# Patient Record
Sex: Female | Born: 1995 | Race: White | Hispanic: No | Marital: Married | Smoking: Current every day smoker
Health system: Southern US, Community
[De-identification: ages and names within clinical notes are randomized; demographics above are authoritative.]

## PROBLEM LIST (undated history)

## (undated) DIAGNOSIS — F329 Major depressive disorder, single episode, unspecified: Secondary | ICD-10-CM

## (undated) DIAGNOSIS — K219 Gastro-esophageal reflux disease without esophagitis: Secondary | ICD-10-CM

## (undated) DIAGNOSIS — F32A Depression, unspecified: Secondary | ICD-10-CM

## (undated) HISTORY — PX: SUPERFICIAL LYMPH NODE BIOPSY / EXCISION: SUR127

---
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
## 2017-12-22 ENCOUNTER — Encounter: Payer: Self-pay | Admitting: Emergency Medicine

## 2017-12-22 ENCOUNTER — Emergency Department
Admission: EM | Admit: 2017-12-22 | Discharge: 2017-12-22 | Disposition: A | Discharge status: Home / Self Care | Payer: Self-pay | Attending: Emergency Medicine | Admitting: Emergency Medicine

## 2017-12-22 ENCOUNTER — Other Ambulatory Visit: Payer: Self-pay

## 2017-12-22 DIAGNOSIS — Z5321 Procedure and treatment not carried out due to patient leaving prior to being seen by health care provider: Secondary | ICD-10-CM | POA: Insufficient documentation

## 2017-12-22 DIAGNOSIS — K0889 Other specified disorders of teeth and supporting structures: Secondary | ICD-10-CM | POA: Insufficient documentation

## 2017-12-22 NOTE — ED Triage Notes (Signed)
PT c/o wisdom tooth pain x77months but worsening xfew days. Denies fever.

## 2018-02-25 ENCOUNTER — Emergency Department: Payer: Self-pay

## 2018-02-25 ENCOUNTER — Other Ambulatory Visit: Payer: Self-pay

## 2018-02-25 ENCOUNTER — Emergency Department
Admission: EM | Admit: 2018-02-25 | Discharge: 2018-02-25 | Disposition: A | Discharge status: Home / Self Care | Payer: Self-pay | Attending: Emergency Medicine | Admitting: Emergency Medicine

## 2018-02-25 ENCOUNTER — Encounter: Payer: Self-pay | Admitting: Emergency Medicine

## 2018-02-25 DIAGNOSIS — R0981 Nasal congestion: Secondary | ICD-10-CM | POA: Insufficient documentation

## 2018-02-25 DIAGNOSIS — J209 Acute bronchitis, unspecified: Secondary | ICD-10-CM | POA: Insufficient documentation

## 2018-02-25 DIAGNOSIS — F17210 Nicotine dependence, cigarettes, uncomplicated: Secondary | ICD-10-CM | POA: Insufficient documentation

## 2018-02-25 MED ORDER — BENZONATATE 200 MG PO CAPS: 200.0000 mg | ORAL_CAPSULE | Freq: Three times a day (TID) | ORAL | 0 refills | Status: DC | PRN

## 2018-02-25 MED ORDER — AZITHROMYCIN 250 MG PO TABS: ORAL_TABLET | ORAL | 0 refills | Status: DC

## 2018-02-25 NOTE — ED Notes (Signed)
Pt verbalized understanding of discharge instructions. NAD at this time. 

## 2018-02-25 NOTE — ED Provider Notes (Signed)
[facility] Emergency Department Provider Note  ____________________________________________   First MD Initiated Contact with Patient 02/25/18 1239     (approximate)  I have reviewed the triage vital signs and the nursing notes.   HISTORY  Chief Complaint Cough    HPI Kayla Edwards is a 23 y.o. female presents emergency department complaining of cough and congestion with green and brown mucus.  Symptoms for 7days.  Denies fever, chills, chest pain or shortness of breath.   History reviewed. No pertinent past medical history.  There are no active problems to display for this patient.   History reviewed. No pertinent surgical history.  Prior to Admission medications   Medication Sig Start Date End Date Taking? Authorizing Provider  azithromycin (ZITHROMAX Z-PAK) 250 MG tablet 2 pills today then 1 pill a day for 4 days 02/25/18   Sherrie MustacheFisher, Roselyn BeringSusan W, PA-C  benzonatate (TESSALON) 200 MG capsule Take 1 capsule (200 mg total) by mouth 3 (three) times daily as needed for cough. 02/25/18   Faythe GheeFisher, Rossie Bretado W, PA-C    Allergies Patient has no known allergies.  No family history on file.  Social History Social History   Tobacco Use  . Smoking status: Current Every Day Smoker    Types: Cigarettes  . Smokeless tobacco: Never Used  Substance Use Topics  . Alcohol use: Not Currently  . Drug use: Not Currently    Review of Systems  Constitutional: No fever/chills Eyes: No visual changes. ENT: No sore throat. Respiratory: Positive cough and congestion Genitourinary: Negative for dysuria. Musculoskeletal: Negative for back pain. Skin: Negative for rash.    ____________________________________________   PHYSICAL EXAM:  VITAL SIGNS: ED Triage Vitals  Enc Vitals Group     BP 02/25/18 1154 133/68     Pulse Rate 02/25/18 1154 79     Resp 02/25/18 1154 18     Temp 02/25/18 1154 98.5 F (36.9 C)     Temp Source 02/25/18 1154 Oral     SpO2 02/25/18  1154 97 %     Weight 02/25/18 1155 135 lb (61.2 kg)     Height 02/25/18 1155 5\' 5"  (1.651 m)     Head Circumference --      Peak Flow --      Pain Score 02/25/18 1202 8     Pain Loc --      Pain Edu? --      Excl. in [place]? --     Constitutional: Alert and oriented. Well appearing and in no acute distress. Eyes: Conjunctivae are normal.  Head: Atraumatic. ENT: TMS clear bilaterally Nose: No congestion/rhinnorhea. Mouth/Throat: Mucous membranes are moist.   NECK: Is supple, no lymphadenopathy is noted  cardiovascular: Normal rate, regular rhythm.  Heart sounds are normal Respiratory: Normal respiratory effort.  No retractions, lungs clear to all station, cough is dry and hacking GU: deferred Musculoskeletal: FROM all extremities, warm and well perfused Neurologic:  Normal speech and language.  Skin:  Skin is warm, dry and intact. No rash noted. Psychiatric: Mood and affect are normal. Speech and behavior are normal.  ____________________________________________   LABS (all labs ordered are listed, but only abnormal results are displayed)  Labs Reviewed - No data to display ____________________________________________   ____________________________________________  RADIOLOGY  Chest x-ray is negative for pneumonia  ____________________________________________   PROCEDURES  Procedure(s) performed: No  Procedures    ____________________________________________   INITIAL IMPRESSION / ASSESSMENT AND PLAN / ED COURSE  Pertinent labs & imaging results that  were available during my care of the patient were reviewed by me and considered in my medical decision making (see chart for details).   Patient is 23 year old female presents emergency department with upper respiratory type symptoms.  Physical exam shows a dry cough.  Remainder the exam is unremarkable.  Chest x-ray is negative.  Patient was given a prescription for Z-Pak and Tessalon Perles.  She is given a  work note for today and tomorrow.  She is to return if worsening.  See her regular doctor if not better in 3 days.  She states she understands and will comply.  She discharged stable condition.     As part of my medical decision making, I reviewed the following data within the electronic MEDICAL RECORD NUMBER Nursing notes reviewed and incorporated, Old chart reviewed, Radiograph reviewed chest x-ray is negative, Notes from prior ED visits and [place] Controlled Substance Database  ____________________________________________   FINAL CLINICAL IMPRESSION(S) / ED DIAGNOSES  Final diagnoses:  Acute bronchitis, unspecified organism      NEW MEDICATIONS STARTED DURING THIS VISIT:  New Prescriptions   AZITHROMYCIN (ZITHROMAX Z-PAK) 250 MG TABLET    2 pills today then 1 pill a day for 4 days   BENZONATATE (TESSALON) 200 MG CAPSULE    Take 1 capsule (200 mg total) by mouth 3 (three) times daily as needed for cough.     Note:  This document was prepared using Dragon voice recognition software and may include unintentional dictation errors.     Faythe Ghee, PA-C 02/25/18 1335    Emily Filbert, MD 02/25/18 [phone]

## 2018-02-25 NOTE — ED Triage Notes (Signed)
Cough since Saturday.  Seen at [facility] once already .

## 2018-02-25 NOTE — Discharge Instructions (Addendum)
Follow-up with your regular doctor if not better in 3 days.  Return to the emergency department worsening.  Take medications as prescribed.

## 2018-08-24 HISTORY — PX: PILONIDAL CYST EXCISION: SHX744

## 2018-09-27 ENCOUNTER — Emergency Department
Admission: EM | Admit: 2018-09-27 | Discharge: 2018-09-27 | Disposition: A | Discharge status: Home / Self Care | Payer: Self-pay | Attending: Emergency Medicine | Admitting: Emergency Medicine

## 2018-09-27 ENCOUNTER — Emergency Department: Payer: Self-pay

## 2018-09-27 ENCOUNTER — Other Ambulatory Visit: Payer: Self-pay

## 2018-09-27 ENCOUNTER — Encounter: Payer: Self-pay | Admitting: Emergency Medicine

## 2018-09-27 DIAGNOSIS — R748 Abnormal levels of other serum enzymes: Secondary | ICD-10-CM | POA: Insufficient documentation

## 2018-09-27 DIAGNOSIS — R945 Abnormal results of liver function studies: Secondary | ICD-10-CM | POA: Insufficient documentation

## 2018-09-27 DIAGNOSIS — R7989 Other specified abnormal findings of blood chemistry: Secondary | ICD-10-CM

## 2018-09-27 DIAGNOSIS — F17210 Nicotine dependence, cigarettes, uncomplicated: Secondary | ICD-10-CM | POA: Insufficient documentation

## 2018-09-27 DIAGNOSIS — K8050 Calculus of bile duct without cholangitis or cholecystitis without obstruction: Secondary | ICD-10-CM | POA: Insufficient documentation

## 2018-09-27 HISTORY — DX: Gastro-esophageal reflux disease without esophagitis: K21.9

## 2018-09-27 LAB — CBC WITH DIFFERENTIAL/PLATELET
Abs Immature Granulocytes: 0.03 10*3/uL (ref 0.00–0.07)
Basophils Absolute: 0 10*3/uL (ref 0.0–0.1)
Basophils Relative: 0 %
Eosinophils Absolute: 0.2 10*3/uL (ref 0.0–0.5)
Eosinophils Relative: 2 %
HCT: 39.6 % (ref 36.0–46.0)
Hemoglobin: 13.3 g/dL (ref 12.0–15.0)
Immature Granulocytes: 0 %
Lymphocytes Relative: 22 %
Lymphs Abs: 2.2 10*3/uL (ref 0.7–4.0)
MCH: 29 pg (ref 26.0–34.0)
MCHC: 33.6 g/dL (ref 30.0–36.0)
MCV: 86.5 fL (ref 80.0–100.0)
Monocytes Absolute: 0.6 10*3/uL (ref 0.1–1.0)
Monocytes Relative: 5 %
Neutro Abs: 7.2 10*3/uL (ref 1.7–7.7)
Neutrophils Relative %: 71 %
Platelets: 359 10*3/uL (ref 150–400)
RBC: 4.58 MIL/uL (ref 3.87–5.11)
RDW: 13 % (ref 11.5–15.5)
WBC: 10.3 10*3/uL (ref 4.0–10.5)
nRBC: 0 % (ref 0.0–0.2)

## 2018-09-27 LAB — COMPREHENSIVE METABOLIC PANEL
ALT: 91 U/L — ABNORMAL HIGH (ref 0–44)
AST: 125 U/L — ABNORMAL HIGH (ref 15–41)
Albumin: 4.3 g/dL (ref 3.5–5.0)
Alkaline Phosphatase: 104 U/L (ref 38–126)
Anion gap: 7 (ref 5–15)
BUN: 7 mg/dL (ref 6–20)
CO2: 26 mmol/L (ref 22–32)
Calcium: 9.5 mg/dL (ref 8.9–10.3)
Chloride: 107 mmol/L (ref 98–111)
Creatinine, Ser: 0.71 mg/dL (ref 0.44–1.00)
GFR calc Af Amer: >60 mL/min (ref >60)
GFR calc non Af Amer: >60 mL/min (ref >60)
Glucose, Bld: 103 mg/dL — ABNORMAL HIGH (ref 70–99)
Potassium: 3.8 mmol/L (ref 3.5–5.1)
Sodium: 140 mmol/L (ref 135–145)
Total Bilirubin: 0.5 mg/dL (ref 0.3–1.2)
Total Protein: 7.5 g/dL (ref 6.5–8.1)

## 2018-09-27 LAB — LIPASE, BLOOD: Lipase: 54 U/L — ABNORMAL HIGH (ref 11–51)

## 2018-09-27 MED ORDER — ONDANSETRON 4 MG PO TBDP: ORAL_TABLET | ORAL | 0 refills | Status: DC

## 2018-09-27 NOTE — ED Provider Notes (Signed)
[facility] Emergency Department Provider Note  ____________________________________________   First MD Initiated Contact with Patient 09/27/18 [phone]     (approximate)  I have reviewed the triage vital signs and the nursing notes.   HISTORY  Chief Complaint Abdominal Pain    HPI Kayla Edwards is a 23 y.o. female is on medical history includes acid reflux and smoking.  She presents for evaluation of acute onset sharp stabbing aching pain in her epigastrium that radiated through to her back.  The episode only lasted 15 minutes or so and occurred about an hour after she ate a spicy meal of [place] food.  She had no nausea or vomiting.  The symptoms completely resolved and nothing in particular made them better or worse.  She tried taking dose of famotidine as well as some Alka-Seltzer and they did not help but the symptoms did eventually resolve.  She has had issues with burning epigastric pain in the past and has used her acid reflux but she reports that this feels different although she has had some similar but milder episodes in the past.  She has not had any contact with COVID-19 patients of which she is aware.  She denies fever/chills, sore throat, chest pain, shortness of breath, nausea, vomiting, and dysuria.         Past Medical History:  Diagnosis Date  . GERD (gastroesophageal reflux disease)     There are no active problems to display for this patient.   History reviewed. No pertinent surgical history.  Prior to Admission medications   Medication Sig Start Date End Date Taking? Authorizing Provider  azithromycin (ZITHROMAX Z-PAK) 250 MG tablet 2 pills today then 1 pill a day for 4 days Patient not taking: Reported on 09/27/2018 02/25/18   Faythe GheeFisher, Susan W, PA-C  benzonatate (TESSALON) 200 MG capsule Take 1 capsule (200 mg total) by mouth 3 (three) times daily as needed for cough. Patient not taking: Reported on 09/27/2018 02/25/18   Faythe GheeFisher, Susan W,  PA-C  ondansetron (ZOFRAN ODT) 4 MG disintegrating tablet Allow 1-2 tablets to dissolve in your mouth every 8 hours as needed for nausea/vomiting 09/27/18   Loleta RoseForbach, Jazziel Fitzsimmons, MD    Allergies Patient has no known allergies.  History reviewed. No pertinent family history.  Social History Social History   Tobacco Use  . Smoking status: Current Every Day Smoker    Types: Cigarettes  . Smokeless tobacco: Never Used  Substance Use Topics  . Alcohol use: Not Currently  . Drug use: Not Currently    Review of Systems Constitutional: No fever/chills Eyes: No visual changes. ENT: No sore throat. Cardiovascular: Denies chest pain. Respiratory: Denies shortness of breath. Gastrointestinal: Epigastric pain radiating through to the back without nausea nor vomiting, as described above, brief and now resolved. Genitourinary: Negative for dysuria. Musculoskeletal: Negative for neck pain.  Negative for back pain. Integumentary: Negative for rash. Neurological: Negative for headaches, focal weakness or numbness.   ____________________________________________   PHYSICAL EXAM:  VITAL SIGNS: ED Triage Vitals  Enc Vitals Group     BP 09/27/18 0222 129/79     Pulse Rate 09/27/18 0222 96     Resp 09/27/18 0222 18     Temp 09/27/18 0222 98.4 F (36.9 C)     Temp Source 09/27/18 0222 Oral     SpO2 09/27/18 0222 97 %     Weight --      Height --      Head Circumference --  Peak Flow --      Pain Score 09/27/18 0220 0     Pain Loc --      Pain Edu? --      Excl. in [place]? --     Constitutional: Alert and oriented.  No acute distress. Eyes: Conjunctivae are normal.  Head: Atraumatic. Nose: No congestion/rhinnorhea. Mouth/Throat: Mucous membranes are moist. Neck: No stridor.  No meningeal signs.   Cardiovascular: Normal rate, regular rhythm. Good peripheral circulation. Grossly normal heart sounds. Respiratory: Normal respiratory effort.  No retractions. Gastrointestinal: Soft and  nontender with negative Murphy sign and no tenderness at McBurney's point. Musculoskeletal: No lower extremity tenderness nor edema. No gross deformities of extremities. Neurologic:  Normal speech and language. No gross focal neurologic deficits are appreciated.  Skin:  Skin is warm, dry and intact. Psychiatric: Mood and affect are normal. Speech and behavior are normal.  ____________________________________________   LABS (all labs ordered are listed, but only abnormal results are displayed)  Labs Reviewed  COMPREHENSIVE METABOLIC PANEL - Abnormal; Notable for the following components:      Result Value   Glucose, Bld 103 (*)    AST 125 (*)    ALT 91 (*)    All other components within normal limits  LIPASE, BLOOD - Abnormal; Notable for the following components:   Lipase 54 (*)    All other components within normal limits  CBC WITH DIFFERENTIAL/PLATELET  URINALYSIS, COMPLETE (UACMP) WITH MICROSCOPIC   ____________________________________________  EKG  None - EKG not ordered by ED physician ____________________________________________  RADIOLOGY Ursula Alert, personally viewed and evaluated these images (plain radiographs) as part of my medical decision making, as well as reviewing the written report by the radiologist.  ED MD interpretation:  multiple gallstones, no evidence of cholecystitis  Official radiology report(s): US Abdomen Limited Ruq  Result Date: 09/27/2018 CLINICAL DATA:  Epigastric pain. EXAM: ULTRASOUND ABDOMEN LIMITED RIGHT UPPER QUADRANT COMPARISON:  Chest x-ray 02/25/2018. FINDINGS: Gallbladder: Gallstones measuring up to 1.4 cm noted. Gallbladder wall thickness 2.1 mm. Negative Murphy sign. Common bile duct: Diameter: 4.8 mm Liver: No focal lesion identified. Within normal limits in parenchymal echogenicity. Portal vein is patent on color Doppler imaging with normal direction of blood flow towards the liver. Other: None. IMPRESSION: Multiple gallstones  measuring up to 1.4 cm. No evidence of cholecystitis or biliary distention. Electronically Signed   By: Marcello Moores  Register   On: 09/27/2018 05:37    ____________________________________________   PROCEDURES   Procedure(s) performed (including Critical Care):  Procedures   ____________________________________________   INITIAL IMPRESSION / MDM / [place] / ED COURSE  As part of my medical decision making, I reviewed the following data within the [place] notes reviewed and incorporated, Labs reviewed  and Notes from prior ED visits   Differential diagnosis includes, but is not limited to, acid reflux, hiatal hernia, biliary colic.  The patient is well-appearing and in no distress and currently has no tenderness to palpation of the abdomen.  I likely would not obtain an ultrasound except the fact she has a mildly elevated lipase and elevated LFTs.  This is likely nonspecific and she does not meet a clinical picture of pancreatitis but she may be suffering from some biliary colic.  We will obtain a reticulocyte quadrant ultrasound to look for evidence of gallstones but anticipate discharge and outpatient management.  She understands and agrees with the plan.      Clinical Course as of Aug 04  [id number]  Tue Sep 27, 2018  0445 Ultrasound pending, double-checked with technologist Lupita Leash(Donna) and she confirmed the patient is next on the list.   [CF]  0740 The patient has multiple gallstones but no sign of obstruction and no sign of cholecystitis.  She has continued to be completely asymptomatic throughout her stay in the emergency department.  No indication for admission.  I updated her about the results and gave her all of the information about following up with surgery.  I gave her my usual customary return precautions and she understands and agrees with the plan.  US ABDOMEN LIMITED RUQ [CF]    Clinical Course User Index [CF] Loleta RoseForbach, Ellar Hakala, MD      ____________________________________________  FINAL CLINICAL IMPRESSION(S) / ED DIAGNOSES  Final diagnoses:  Elevated lipase  Elevated LFTs  Biliary colic     MEDICATIONS GIVEN DURING THIS VISIT:  Medications - No data to display   ED Discharge Orders         Ordered    ondansetron (ZOFRAN ODT) 4 MG disintegrating tablet     09/27/18 0606          *Please note:  Kayla HuddleCheyenne Schuff was evaluated in Emergency Department on 09/27/2018 for the symptoms described in the history of present illness. She was evaluated in the context of the global COVID-19 pandemic, which necessitated consideration that the patient might be at risk for infection with the SARS-CoV-2 virus that causes COVID-19. Institutional protocols and algorithms that pertain to the evaluation of patients at risk for COVID-19 are in a state of rapid change based on information released by regulatory bodies including the CDC and federal and state organizations. These policies and algorithms were followed during the patient's care in the ED.  Some ED evaluations and interventions may be delayed as a result of limited staffing during the pandemic.*  Note:  This document was prepared using Dragon voice recognition software and may include unintentional dictation errors.   Loleta RoseForbach, Bebe Moncure, MD 09/27/18 [phone]

## 2018-09-27 NOTE — Discharge Instructions (Signed)
You have been seen in the Emergency Department (ED) for abdominal pain.  Your evaluation suggests that your pain is caused by gallstones.  Fortunately you do not need immediate surgery at this time, but it is important that you follow up with a surgeon as an outpatient; typically surgical removal of the gallbladder is the only thing that will definitively fix your issue.  Read through the included information about a bland diet, and use any prescribed medications as instructed.  Avoid smoking and alcohol use.  Try using Tylenol and ibuprofen for pain control.  Please follow up as instructed above regarding todays emergent visit and the symptoms that are bothering you.  Return to the ED if your abdominal pain worsens or fails to improve, you develop bloody vomiting, bloody diarrhea, you are unable to tolerate fluids due to vomiting, fever greater than 101, or other symptoms that concern you.

## 2018-09-27 NOTE — ED Triage Notes (Signed)
Patient ambulatory to triage with steady gait, without difficulty or distress noted; pt reports episode of generalized abd pain radiating into back PTA; denies any c/o at present, "just wants to get checked out"

## 2018-09-28 ENCOUNTER — Encounter: Payer: Self-pay | Admitting: Emergency Medicine

## 2018-09-28 ENCOUNTER — Other Ambulatory Visit: Payer: Self-pay

## 2018-09-28 ENCOUNTER — Telehealth: Payer: Self-pay | Admitting: General Surgery

## 2018-09-28 DIAGNOSIS — Z20828 Contact with and (suspected) exposure to other viral communicable diseases: Secondary | ICD-10-CM | POA: Diagnosis present

## 2018-09-28 DIAGNOSIS — Z791 Long term (current) use of non-steroidal anti-inflammatories (NSAID): Secondary | ICD-10-CM

## 2018-09-28 DIAGNOSIS — Z91041 Radiographic dye allergy status: Secondary | ICD-10-CM

## 2018-09-28 DIAGNOSIS — Z79899 Other long term (current) drug therapy: Secondary | ICD-10-CM

## 2018-09-28 DIAGNOSIS — K8070 Calculus of gallbladder and bile duct without cholecystitis without obstruction: Principal | ICD-10-CM | POA: Diagnosis present

## 2018-09-28 DIAGNOSIS — F17210 Nicotine dependence, cigarettes, uncomplicated: Secondary | ICD-10-CM | POA: Diagnosis present

## 2018-09-28 DIAGNOSIS — Z716 Tobacco abuse counseling: Secondary | ICD-10-CM

## 2018-09-28 DIAGNOSIS — E663 Overweight: Secondary | ICD-10-CM | POA: Diagnosis present

## 2018-09-28 DIAGNOSIS — F40240 Claustrophobia: Secondary | ICD-10-CM | POA: Diagnosis present

## 2018-09-28 DIAGNOSIS — K8050 Calculus of bile duct without cholangitis or cholecystitis without obstruction: Secondary | ICD-10-CM

## 2018-09-28 DIAGNOSIS — K7689 Other specified diseases of liver: Secondary | ICD-10-CM | POA: Diagnosis present

## 2018-09-28 DIAGNOSIS — K219 Gastro-esophageal reflux disease without esophagitis: Secondary | ICD-10-CM | POA: Diagnosis present

## 2018-09-28 NOTE — Telephone Encounter (Signed)
I talked with the patient and she states she is having some nausea but no vomiting. She denies any pain and states her urine has since her ED visit turned dark and orange in color, worried "something is wrong". I spoke with Dr Dahlia Byes and he advised her to get labs completed and her been seen by Dr Celine Ahr at [facility] tomorrow, pt aware to have labs completed  prior to appointment at [facility].

## 2018-09-28 NOTE — Telephone Encounter (Signed)
Patient is calling she was referred from the ED, patient is a little concerned about the color of her urine and is asking if we could give her a call, denies having a smell and pain when going to the bathroom. Please call patient and advise.

## 2018-09-28 NOTE — ED Triage Notes (Signed)
Pt presents to ED tearful and anxious due to yellow color to her eyes and dark colored urine. Pt states she was seen in this ED on the 4th and dx with gallstones. Pt made follow up appt for tomorrow to see about having her gallbladder removed. Pt states she first noticed her eye color this afternoon and she is concerned that something is wrong with her gallbladder. Denies increase in abd pain.

## 2018-09-29 ENCOUNTER — Inpatient Hospital Stay (HOSPITAL_COMMUNITY)
Admission: AD | Admit: 2018-09-29 | Discharge: 2018-10-02 | DRG: 419 | Disposition: A | Discharge status: Home / Self Care | Payer: Self-pay | Source: Other Acute Inpatient Hospital | Attending: Internal Medicine | Admitting: Internal Medicine

## 2018-09-29 ENCOUNTER — Other Ambulatory Visit: Payer: Medicaid Other

## 2018-09-29 ENCOUNTER — Inpatient Hospital Stay: Payer: Self-pay

## 2018-09-29 ENCOUNTER — Emergency Department: Payer: Self-pay

## 2018-09-29 ENCOUNTER — Ambulatory Visit: Payer: Medicaid Other | Admitting: General Surgery

## 2018-09-29 ENCOUNTER — Encounter (HOSPITAL_COMMUNITY): Payer: Self-pay

## 2018-09-29 ENCOUNTER — Inpatient Hospital Stay
Admission: EM | Admit: 2018-09-29 | Discharge: 2018-09-29 | DRG: 446 | Disposition: A | Discharge status: Other Acute Inpatient Hospital | Payer: Self-pay | Attending: Internal Medicine | Admitting: Internal Medicine

## 2018-09-29 ENCOUNTER — Inpatient Hospital Stay (HOSPITAL_COMMUNITY)
Admission: AD | Admit: 2018-09-29 | Payer: Medicaid Other | Source: Other Acute Inpatient Hospital | Admitting: Internal Medicine

## 2018-09-29 DIAGNOSIS — K8064 Calculus of gallbladder and bile duct with chronic cholecystitis without obstruction: Principal | ICD-10-CM | POA: Diagnosis present

## 2018-09-29 DIAGNOSIS — Z20828 Contact with and (suspected) exposure to other viral communicable diseases: Secondary | ICD-10-CM | POA: Diagnosis present

## 2018-09-29 DIAGNOSIS — K760 Fatty (change of) liver, not elsewhere classified: Secondary | ICD-10-CM | POA: Diagnosis present

## 2018-09-29 DIAGNOSIS — K8020 Calculus of gallbladder without cholecystitis without obstruction: Secondary | ICD-10-CM

## 2018-09-29 DIAGNOSIS — K8050 Calculus of bile duct without cholangitis or cholecystitis without obstruction: Secondary | ICD-10-CM

## 2018-09-29 DIAGNOSIS — K319 Disease of stomach and duodenum, unspecified: Secondary | ICD-10-CM | POA: Diagnosis present

## 2018-09-29 DIAGNOSIS — F17210 Nicotine dependence, cigarettes, uncomplicated: Secondary | ICD-10-CM | POA: Diagnosis present

## 2018-09-29 DIAGNOSIS — K8070 Calculus of gallbladder and bile duct without cholecystitis without obstruction: Secondary | ICD-10-CM | POA: Diagnosis present

## 2018-09-29 DIAGNOSIS — Z720 Tobacco use: Secondary | ICD-10-CM | POA: Diagnosis present

## 2018-09-29 DIAGNOSIS — R17 Unspecified jaundice: Secondary | ICD-10-CM | POA: Diagnosis present

## 2018-09-29 HISTORY — DX: Depression, unspecified: F32.A

## 2018-09-29 LAB — URINALYSIS, COMPLETE (UACMP) WITH MICROSCOPIC
Glucose, UA: NEGATIVE mg/dL
Hgb urine dipstick: NEGATIVE
Ketones, ur: 20 mg/dL — AB
Leukocytes,Ua: NEGATIVE
Nitrite: NEGATIVE
Protein, ur: NEGATIVE mg/dL
Specific Gravity, Urine: 1.017 (ref 1.005–1.030)
pH: 6 (ref 5.0–8.0)

## 2018-09-29 LAB — CBC
HCT: 40.7 % (ref 36.0–46.0)
Hemoglobin: 13.6 g/dL (ref 12.0–15.0)
MCH: 28.8 pg (ref 26.0–34.0)
MCHC: 33.4 g/dL (ref 30.0–36.0)
MCV: 86.2 fL (ref 80.0–100.0)
Platelets: 314 10*3/uL (ref 150–400)
RBC: 4.72 MIL/uL (ref 3.87–5.11)
RDW: 13.1 % (ref 11.5–15.5)
WBC: 5 10*3/uL (ref 4.0–10.5)
nRBC: 0 % (ref 0.0–0.2)

## 2018-09-29 LAB — COMPREHENSIVE METABOLIC PANEL
ALT: 498 U/L — ABNORMAL HIGH (ref 0–44)
AST: 282 U/L — ABNORMAL HIGH (ref 15–41)
Albumin: 4.5 g/dL (ref 3.5–5.0)
Alkaline Phosphatase: 188 U/L — ABNORMAL HIGH (ref 38–126)
Anion gap: 10 (ref 5–15)
BUN: 8 mg/dL (ref 6–20)
CO2: 23 mmol/L (ref 22–32)
Calcium: 9.8 mg/dL (ref 8.9–10.3)
Chloride: 104 mmol/L (ref 98–111)
Creatinine, Ser: 0.83 mg/dL (ref 0.44–1.00)
GFR calc Af Amer: >60 mL/min (ref >60)
GFR calc non Af Amer: >60 mL/min (ref >60)
Glucose, Bld: 128 mg/dL — ABNORMAL HIGH (ref 70–99)
Potassium: 3.5 mmol/L (ref 3.5–5.1)
Sodium: 137 mmol/L (ref 135–145)
Total Bilirubin: 5.5 mg/dL — ABNORMAL HIGH (ref 0.3–1.2)
Total Protein: 8 g/dL (ref 6.5–8.1)

## 2018-09-29 LAB — TSH: TSH: 2.002 u[IU]/mL (ref 0.350–4.500)

## 2018-09-29 LAB — HEMOGLOBIN A1C
Hgb A1c MFr Bld: 4.9 % (ref 4.8–5.6)
Mean Plasma Glucose: 93.93 mg/dL

## 2018-09-29 LAB — SARS CORONAVIRUS 2 BY RT PCR (HOSPITAL ORDER, PERFORMED IN ~~LOC~~ HOSPITAL LAB): SARS Coronavirus 2: NEGATIVE

## 2018-09-29 LAB — TYPE AND SCREEN
ABO/RH(D): A POS
Antibody Screen: NEGATIVE

## 2018-09-29 LAB — PREGNANCY, URINE: Preg Test, Ur: NEGATIVE

## 2018-09-29 LAB — ACETAMINOPHEN LEVEL: Acetaminophen (Tylenol), Serum: <10 ug/mL — ABNORMAL LOW (ref 10–30)

## 2018-09-29 MED ORDER — SODIUM CHLORIDE 0.9 % IV SOLN
INTRAVENOUS | Status: AC
  Administered 2018-09-29 – 2018-09-30 (×2): via INTRAVENOUS

## 2018-09-29 MED ORDER — GADOBUTROL 1 MMOL/ML IV SOLN
7.0000 mL | Freq: Once | INTRAVENOUS | Status: AC | PRN
  Administered 2018-09-29: 7 mL via INTRAVENOUS

## 2018-09-29 MED ORDER — ACETAMINOPHEN 650 MG RE SUPP: 650.0000 mg | Freq: Four times a day (QID) | RECTAL | Status: DC | PRN

## 2018-09-29 MED ORDER — NICOTINE 21 MG/24HR TD PT24
21.0000 mg | MEDICATED_PATCH | Freq: Every day | TRANSDERMAL | Status: DC
  Administered 2018-09-30 – 2018-10-02 (×2): 21 mg via TRANSDERMAL
  Filled 2018-09-29 (×2): qty 1

## 2018-09-29 MED ORDER — PROCHLORPERAZINE EDISYLATE 10 MG/2ML IJ SOLN
10.0000 mg | Freq: Four times a day (QID) | INTRAMUSCULAR | Status: DC | PRN
  Filled 2018-09-29: qty 2

## 2018-09-29 MED ORDER — SODIUM CHLORIDE 0.9 % IV SOLN
INTRAVENOUS | Status: DC
  Administered 2018-09-29: 07:00:00 via INTRAVENOUS

## 2018-09-29 MED ORDER — LORAZEPAM 0.5 MG PO TABS
0.5000 mg | ORAL_TABLET | Freq: Once | ORAL | Status: AC | PRN
  Administered 2018-09-29: 0.5 mg via ORAL
  Filled 2018-09-29: qty 1

## 2018-09-29 MED ORDER — DOCUSATE SODIUM 100 MG PO CAPS
100.0000 mg | ORAL_CAPSULE | Freq: Two times a day (BID) | ORAL | Status: DC
  Administered 2018-09-29: 100 mg via ORAL
  Filled 2018-09-29: qty 1

## 2018-09-29 MED ORDER — LORAZEPAM 2 MG/ML IJ SOLN
1.0000 mg | Freq: Once | INTRAMUSCULAR | Status: AC
  Administered 2018-09-29: 1 mg via INTRAVENOUS
  Filled 2018-09-29: qty 1

## 2018-09-29 MED ORDER — ONDANSETRON HCL 4 MG/2ML IJ SOLN: 4.0000 mg | Freq: Four times a day (QID) | INTRAMUSCULAR | Status: DC | PRN

## 2018-09-29 MED ORDER — OXYCODONE HCL 5 MG PO TABS
5.0000 mg | ORAL_TABLET | ORAL | Status: DC | PRN
  Administered 2018-10-01: 5 mg via ORAL
  Filled 2018-09-29 (×3): qty 1

## 2018-09-29 MED ORDER — ONDANSETRON HCL 4 MG PO TABS: 4.0000 mg | ORAL_TABLET | Freq: Four times a day (QID) | ORAL | Status: DC | PRN

## 2018-09-29 MED ORDER — NICOTINE 21 MG/24HR TD PT24: 21.0000 mg | MEDICATED_PATCH | Freq: Every day | TRANSDERMAL | 0 refills | Status: AC

## 2018-09-29 MED ORDER — ACETAMINOPHEN 325 MG PO TABS: 650.0000 mg | ORAL_TABLET | Freq: Four times a day (QID) | ORAL | Status: DC | PRN

## 2018-09-29 MED ORDER — MORPHINE SULFATE (PF) 2 MG/ML IV SOLN: 1.0000 mg | INTRAVENOUS | Status: DC | PRN

## 2018-09-29 MED ORDER — LORAZEPAM 2 MG/ML IJ SOLN
0.5000 mg | Freq: Once | INTRAMUSCULAR | Status: AC
  Administered 2018-09-29: 0.5 mg via INTRAVENOUS
  Filled 2018-09-29: qty 1

## 2018-09-29 MED ORDER — NICOTINE 21 MG/24HR TD PT24
21.0000 mg | MEDICATED_PATCH | Freq: Every day | TRANSDERMAL | Status: DC
  Administered 2018-09-29: 21 mg via TRANSDERMAL
  Filled 2018-09-29: qty 1

## 2018-09-29 MED ORDER — FAMOTIDINE 20 MG PO TABS: 20.0000 mg | ORAL_TABLET | Freq: Every day | ORAL | Status: DC | PRN

## 2018-09-29 NOTE — Progress Notes (Signed)
Pt. is being transferred to [facility]. Pt.has left the floor via stretcher with the transport team.

## 2018-09-29 NOTE — ED Notes (Signed)
Lab notified of urine pregnancy test as ordered by EDP. Lab reports they have sufficient urine from previously collected UA and will begin running test at this time.

## 2018-09-29 NOTE — Consult Note (Signed)
[facility] SURGICAL CONSULTATION NOTE (initial) - cpt: 16109: [id number]   HISTORY OF PRESENT ILLNESS (HPI):  23 y.o. female presented to [facility] ED overnight for evaluation of abdominal pain. Patient was seen in the ED on 08/04 and diagnosed with cholelithiasis after an episode of epigastric abdominal pain that onset after fatty meal. She was discharged with general surgery follow up. However, yesterday evening, she noticed a yellowing of her eyes and a dark color to her urine which concerned her, so she re-presented to the ED. She denied any further abdominal pain at that time. No fever, chills, nausea, or emesis. She has a history of similar pain in the past but attributed this to GERD. IN the past she has tried antacids for relief of her symptoms with intermittent success. No previous abdominal surgeries. No other additional complaints. Work up in the ED overnight was concerning for elevated LFTs and hyperbilirubinemia. She was admitted to the medicine service for obstructive jaundice work up.      Surgery is consulted by hospitalist physician Dr. Joycelyn RuaMichael Diamond, MD in this context for evaluation and management of jaundice and hyperbilirubinemia in the setting of cholelithiasis.   PAST MEDICAL HISTORY (PMH):  Past Medical History:  Diagnosis Date  . GERD (gastroesophageal reflux disease)      PAST SURGICAL HISTORY (PSH):  History reviewed. No pertinent surgical history.   MEDICATIONS:  Prior to Admission medications   Medication Sig Start Date End Date Taking? Authorizing Provider  famotidine (PEPCID) 20 MG tablet Take 20 mg by mouth daily as needed for heartburn or indigestion.   Yes [provider]  ibuprofen (ADVIL) 200 MG tablet Take 200-400 mg by mouth every 6 (six) hours as needed for fever, headache or mild pain.   Yes [provider]  azithromycin (ZITHROMAX Z-PAK) 250 MG tablet 2 pills today then 1 pill a day for 4 days Patient not taking: Reported on  09/27/2018 02/25/18   Faythe GheeFisher, Susan W, PA-C  benzonatate (TESSALON) 200 MG capsule Take 1 capsule (200 mg total) by mouth 3 (three) times daily as needed for cough. Patient not taking: Reported on 09/27/2018 02/25/18   Faythe GheeFisher, Susan W, PA-C  ondansetron (ZOFRAN ODT) 4 MG disintegrating tablet Allow 1-2 tablets to dissolve in your mouth every 8 hours as needed for nausea/vomiting 09/27/18   Loleta RoseForbach, Cory, MD     ALLERGIES:  Allergies  Allergen Reactions  . Contrast Media [Iodinated Diagnostic Agents]      SOCIAL HISTORY:  Social History   Socioeconomic History  . Marital status: Married    Spouse name: Not on file  . Number of children: Not on file  . Years of education: Not on file  . Highest education level: Not on file  Occupational History  . Not on file  Social Needs  . Financial resource strain: Not on file  . Food insecurity    Worry: Not on file    Inability: Not on file  . Transportation needs    Medical: Not on file    Non-medical: Not on file  Tobacco Use  . Smoking status: Current Every Day Smoker    Types: Cigarettes  . Smokeless tobacco: Never Used  Substance and Sexual Activity  . Alcohol use: Not Currently  . Drug use: Not Currently  . Sexual activity: Not on file  Lifestyle  . Physical activity    Days per week: Not on file    Minutes per session: Not on file  . Stress: Not on file  Relationships  . Social Herbalist on phone: Not on file    Gets together: Not on file    Attends religious service: Not on file    Active member of club or organization: Not on file    Attends meetings of clubs or organizations: Not on file    Relationship status: Not on file  . Intimate partner violence    Fear of current or ex partner: Not on file    Emotionally abused: Not on file    Physically abused: Not on file    Forced sexual activity: Not on file  Other Topics Concern  . Not on file  Social History Narrative  . Not on file     FAMILY HISTORY:  No  family history on file.    REVIEW OF SYSTEMS:  Review of Systems  Constitutional: Negative for chills, fever, malaise/fatigue and weight loss.  HENT: Negative for congestion and sore throat.   Eyes:       + Scleral Icterus  Respiratory: Negative for cough and shortness of breath.   Cardiovascular: Negative for chest pain and palpitations.  Gastrointestinal: Positive for abdominal pain and heartburn. Negative for constipation, diarrhea, nausea and vomiting.  Genitourinary:       + Dark Urine  Neurological: Negative for dizziness and headaches.  All other systems reviewed and are negative.   VITAL SIGNS:  Temp:  [97.7 F (36.5 C)-98.7 F (37.1 C)] 98.7 F (37.1 C) (08/06 0651) Pulse Rate:  [86-101] 101 (08/06 0651) Resp:  [18] 18 (08/06 0651) BP: (121-141)/(68-94) 139/94 (08/06 0651) SpO2:  [98 %] 98 % (08/06 0651) Weight:  [72.6 kg] 72.6 kg (08/05 2349)     Height: 5\' 5"  (165.1 cm) Weight: 72.6 kg BMI (Calculated): 26.63   INTAKE/OUTPUT:  No intake/output data recorded.  PHYSICAL EXAM:  Physical Exam Vitals signs and nursing note reviewed.  Constitutional:      General: She is not in acute distress.    Appearance: She is well-developed. She is obese. She is not ill-appearing.  HENT:     Head: Normocephalic and atraumatic.  Eyes:     General: Scleral icterus (Mild) present.     Extraocular Movements: Extraocular movements intact.  Cardiovascular:     Rate and Rhythm: Normal rate and regular rhythm.     Heart sounds: Normal heart sounds. No murmur. No friction rub. No gallop.   Pulmonary:     Effort: Pulmonary effort is normal. No respiratory distress.     Breath sounds: No wheezing or rhonchi.  Abdominal:     General: There is no distension.     Palpations: Abdomen is soft.     Tenderness: There is no abdominal tenderness. There is no guarding or rebound. Negative signs include Murphy's sign.  Genitourinary:    Comments: Deferred Skin:    General: Skin is warm  and dry.     Coloration: Skin is not jaundiced or pale.  Neurological:     General: No focal deficit present.     Mental Status: She is alert and oriented to person, place, and time.  Psychiatric:        Mood and Affect: Mood normal.        Behavior: Behavior normal.      Labs:  CBC Latest Ref Rng & Units 09/29/2018 09/27/2018  WBC 4.0 - 10.5 K/uL 5.0 10.3  Hemoglobin 12.0 - 15.0 g/dL 13.6 13.3  Hematocrit 36.0 - 46.0 % 40.7 39.6  Platelets 150 -  400 K/uL 314 359   CMP Latest Ref Rng & Units 09/29/2018 09/27/2018  Glucose 70 - 99 mg/dL 161(W128(H) 960(A103(H)  BUN 6 - 20 mg/dL 8 7  Creatinine 5.400.44 - 1.00 mg/dL 9.810.83 1.910.71  Sodium 478135 - 145 mmol/L 137 140  Potassium 3.5 - 5.1 mmol/L 3.5 3.8  Chloride 98 - 111 mmol/L 104 107  CO2 22 - 32 mmol/L 23 26  Calcium 8.9 - 10.3 mg/dL 9.8 9.5  Total Protein 6.5 - 8.1 g/dL 8.0 7.5  Total Bilirubin 0.3 - 1.2 mg/dL 5.5(H) 0.5  Alkaline Phos 38 - 126 U/L 188(H) 104  AST 15 - 41 U/L 282(H) 125(H)  ALT 0 - 44 U/L 498(H) 91(H)     Imaging studies:   CT Abdomen/PElvis (09/29/2018) personally reviewed with known cholelithiasis without cholecystitis and radiologist report reviewed below:  IMPRESSION: No acute abnormality noted. Known cholelithiasis is not as well appreciated on CT examination. The significant increase in liver function tests is likely related to hepatocellular dysfunction as no significant obstructive changes seen.   Assessment/Plan: (ICD-10's: 23K80.20) 23 y.o. female with now resolved abdominal pain likely secondary to symptomatic cholelithiasis although was found to have hyperbilirubinemia concerning for obstructive jaundice   - No benefit from HIDA scan at this time; will cancel  - Will obtain MRCP instead to evaluate for choledocholithiasis; pending results may need GI evaluation   - NPO + IVF   - pain control prn; antiemetics prn  - monitor abdominal examination  - will benefit from cholecystectomy in the future; timing of this  pending MRCP results.   - further management per primary team  All of the above findings and recommendations were discussed with the patient, and all of patient's questions were answered to herexpressed satisfaction.  Thank you for the opportunity to participate in this patient's care.   -- Lynden OxfordZachary , PA-C [facility] 09/29/2018, 9:00 AM [phone] M-F: 7am - 4pm

## 2018-09-29 NOTE — ED Notes (Addendum)
ED TO INPATIENT HANDOFF REPORT  ED Nurse Name and Phone #:  Elijah Birkom RN  [phone]  S Name/Age/Gender Kayla Edwards 22 y.o. female Room/Bed: ED18A/ED18A  Code Status   Code Status: Not on file  Home/SNF/Other Home Patient oriented to: self, place, time and situation Is this baseline? Yes   Triage Complete: Triage complete  Chief Complaint Yellow Eyes  Triage Note Pt presents to ED tearful and anxious due to yellow color to her eyes and dark colored urine. Pt states she was seen in this ED on the 4th and dx with gallstones. Pt made follow up appt for tomorrow to see about having her gallbladder removed. Pt states she first noticed her eye color this afternoon and she is concerned that something is wrong with her gallbladder. Denies increase in abd pain.    Allergies Allergies  Allergen Reactions  . Contrast Media [Iodinated Diagnostic Agents]     Level of Care/Admitting Diagnosis ED Disposition    ED Disposition Condition Comment   Admit  [facility] [[id number]]  Level of Care: Med-Surg [16]  Covid Evaluation: Asymptomatic Screening Protocol (No Symptoms)  Diagnosis: Cholestatic jaundice [960454][[id number]]  Admitting Physician: Arnaldo NatalDIAMOND, MICHAEL S [[id number]]  Attending Physician: Arnaldo NatalDIAMOND, MICHAEL S [[id number]]  Estimated length of stay: past midnight tomorrow  Certification:: I certify this patient will need inpatient services for at least 2 midnights  PT Class (Do Not Modify): Inpatient [101]  PT Acc Code (Do Not Modify): Private [1]       B Medical/Surgery History Past Medical History:  Diagnosis Date  . GERD (gastroesophageal reflux disease)    History reviewed. No pertinent surgical history.   A IV Location/Drains/Wounds Patient Lines/Drains/Airways Status   Active Line/Drains/Airways    Name:   Placement date:   Placement time:   Site:   Days:   Peripheral IV 09/29/18 Left Antecubital   09/29/18    0133    Antecubital   less than 1           Intake/Output Last 24 hours No intake or output data in the 24 hours ending 09/29/18 0559  Labs/Imaging Results for orders placed or performed during the hospital encounter of 09/29/18 (from the past 48 hour(s))  Comprehensive metabolic panel     Status: Abnormal   Collection Time: 09/29/18 12:11 AM  Result Value Ref Range   Sodium 137 135 - 145 mmol/L   Potassium 3.5 3.5 - 5.1 mmol/L   Chloride 104 98 - 111 mmol/L   CO2 23 22 - 32 mmol/L   Glucose, Bld 128 (H) 70 - 99 mg/dL   BUN 8 6 - 20 mg/dL   Creatinine, Ser 2.130.83 0.44 - 1.00 mg/dL   Calcium 9.8 8.9 - 08.610.3 mg/dL   Total Protein 8.0 6.5 - 8.1 g/dL   Albumin 4.5 3.5 - 5.0 g/dL   AST 578282 (H) 15 - 41 U/L   ALT 498 (H) 0 - 44 U/L   Alkaline Phosphatase 188 (H) 38 - 126 U/L   Total Bilirubin 5.5 (H) 0.3 - 1.2 mg/dL    Comment: RESULTS VERIFIED BY REPEAT TESTING SNG   GFR calc non Af Amer >60 >60 mL/min   GFR calc Af Amer >60 >60 mL/min   Anion gap 10 5 - 15    Comment: Performed at [facility], [address] [place], [place] [address]  CBC     Status: None   Collection Time: 09/29/18 12:11 AM  Result Value Ref Range  WBC 5.0 4.0 - 10.5 K/uL   RBC 4.72 3.87 - 5.11 MIL/uL   Hemoglobin 13.6 12.0 - 15.0 g/dL   HCT 16.140.7 09.636.0 - 04.546.0 %   MCV 86.2 80.0 - 100.0 fL   MCH 28.8 26.0 - 34.0 pg   MCHC 33.4 30.0 - 36.0 g/dL   RDW 40.913.1 81.111.5 - 91.415.5 %   Platelets 314 150 - 400 K/uL   nRBC 0.0 0.0 - 0.2 %    Comment: Performed at [facility], [address] [place], [place] [address]  Urinalysis, Complete w Microscopic     Status: Abnormal   Collection Time: 09/29/18 12:11 AM  Result Value Ref Range   Color, Urine AMBER (A) YELLOW    Comment: BIOCHEMICALS MAY BE AFFECTED BY COLOR   APPearance CLEAR (A) CLEAR   Specific Gravity, Urine 1.017 1.005 - 1.030   pH 6.0 5.0 - 8.0   Glucose, UA NEGATIVE NEGATIVE mg/dL   Hgb urine dipstick NEGATIVE NEGATIVE   Bilirubin Urine MODERATE (A) NEGATIVE   Ketones, ur 20  (A) NEGATIVE mg/dL   Protein, ur NEGATIVE NEGATIVE mg/dL   Nitrite NEGATIVE NEGATIVE   Leukocytes,Ua NEGATIVE NEGATIVE   RBC / HPF 0-5 0 - 5 RBC/hpf   WBC, UA 6-10 0 - 5 WBC/hpf   Bacteria, UA RARE (A) NONE SEEN   Squamous Epithelial / LPF 6-10 0 - 5   Mucus PRESENT     Comment: Performed at [facility], [address] [place], [place] [address]  Pregnancy, urine     Status: None   Collection Time: 09/29/18 12:11 AM  Result Value Ref Range   Preg Test, Ur NEGATIVE NEGATIVE    Comment: Performed at [facility], [address] [place], [place] [address]  Type and screen [facility]     Status: None   Collection Time: 09/29/18  1:34 AM  Result Value Ref Range   ABO/RH(D) A POS    Antibody Screen NEG    Sample Expiration      10/02/2018,2359 Performed at [facility], [address] [place], [place] [address]   SARS Coronavirus 2 [facility](Hospital order, Performed in [facility]) Nasopharyngeal Nasopharyngeal Swab     Status: None   Collection Time: 09/29/18  1:34 AM   Specimen: Nasopharyngeal Swab  Result Value Ref Range   SARS Coronavirus 2 NEGATIVE NEGATIVE    Comment: (NOTE) If result is NEGATIVE SARS-CoV-2 target nucleic acids are NOT DETECTED. The SARS-CoV-2 RNA is generally detectable in upper and lower  respiratory specimens during the acute phase of infection. The lowest  concentration of SARS-CoV-2 viral copies this assay can detect is 250  copies / mL. A negative result does not preclude SARS-CoV-2 infection  and should not be used as the sole basis for treatment or other  patient management decisions.  A negative result may occur with  improper specimen collection / handling, submission of specimen other  than nasopharyngeal swab, presence of viral mutation(s) within the  areas targeted by this assay, and inadequate number of viral copies  (<250 copies / mL). A negative result must be combined with clinical   observations, patient history, and epidemiological information. If result is POSITIVE SARS-CoV-2 target nucleic acids are DETECTED. The SARS-CoV-2 RNA is generally detectable in upper and lower  respiratory specimens dur ing the acute phase of infection.  Positive  results are indicative of active infection with SARS-CoV-2.  Clinical  correlation with patient history  and other diagnostic information is  necessary to determine patient infection status.  Positive results do  not rule out bacterial infection or co-infection with other viruses. If result is PRESUMPTIVE POSTIVE SARS-CoV-2 nucleic acids MAY BE PRESENT.   A presumptive positive result was obtained on the submitted specimen  and confirmed on repeat testing.  While 2019 novel coronavirus  (SARS-CoV-2) nucleic acids may be present in the submitted sample  additional confirmatory testing may be necessary for epidemiological  and / or clinical management purposes  to differentiate between  SARS-CoV-2 and other Sarbecovirus currently known to infect humans.  If clinically indicated additional testing with an alternate test  methodology [phone]) is advised. The SARS-CoV-2 RNA is generally  detectable in upper and lower respiratory sp ecimens during the acute  phase of infection. The expected result is Negative. Fact Sheet for Patients:  StrictlyIdeas.no Fact Sheet for Healthcare Providers: BankingDealers.co.za This test is not yet approved or cleared by the [place] FDA and has been authorized for detection and/or diagnosis of SARS-CoV-2 by FDA under an Emergency Use Authorization (EUA).  This EUA will remain in effect (meaning this test can be used) for the duration of the COVID-19 declaration under Section 564(b)(1) of the Act, 21 U.S.C. section 360bbb-3(b)(1), unless the authorization is terminated or revoked sooner. Performed at [facility], [place], [place] [address]    Ct Abdomen Pelvis Wo Contrast  Result Date: 09/29/2018 CLINICAL DATA:  Epigastric pain and known cholelithiasis with increasing jaundice EXAM: CT ABDOMEN AND PELVIS WITHOUT CONTRAST TECHNIQUE: Multidetector CT imaging of the abdomen and pelvis was performed following the standard protocol without IV contrast. COMPARISON:  Ultrasound from 09/27/2018 FINDINGS: Lower chest: No acute abnormality. Hepatobiliary: Liver is well visualized without significant mass lesion. The gallbladder is well distended. The known gallstones are not well appreciated on this exam. No biliary ductal dilatation is seen. Pancreas: Unremarkable. No pancreatic ductal dilatation or surrounding inflammatory changes. Spleen: Normal in size without focal abnormality. Adrenals/Urinary Tract: Adrenal glands are within normal limits. Kidneys are well visualized bilaterally. No renal calculi or obstructive changes are noted. Bladder is within normal limits. Stomach/Bowel: Stomach is within normal limits. Appendix appears normal. No evidence of bowel wall thickening, distention, or inflammatory changes. Vascular/Lymphatic: No significant vascular findings are present. No enlarged abdominal or pelvic lymph nodes. Reproductive: Uterus and bilateral adnexa are unremarkable. Other: No abdominal wall hernia or abnormality. No abdominopelvic ascites. Musculoskeletal: No acute or significant osseous findings. IMPRESSION: No acute abnormality noted. Known cholelithiasis is not as well appreciated on CT examination. The significant increase in liver function tests is likely related to hepatocellular dysfunction as no significant obstructive changes seen. Electronically Signed   By: Inez Catalina M.D.   On: 09/29/2018 03:13    Pending Labs Unresulted Labs (From admission, onward)    Start     Ordered   09/29/18 0508  Hepatitis panel, acute  Add-on,   AD     09/29/18 0507   Signed and Held  TSH  Add-on,   R     Signed and  Held   Signed and Held  Hemoglobin A1c  Add-on,   R     Signed and Held          Vitals/Pain Today's Vitals   09/28/18 2349 09/29/18 0120 09/29/18 0120 09/29/18 0510  BP: (!) 141/86  136/83 121/68  Pulse: (!) 101  87 86  Resp: 18  18 18   Temp: 97.7 F (36.5 C)  98.1 F (36.7 C)  TempSrc: Oral   Oral  SpO2: 98%  98% 98%  Weight: 72.6 kg     Height: 5\' 5"  (1.651 m)     PainSc: 1  0-No pain  0-No pain    Isolation Precautions No active isolations  Medications Medications  LORazepam (ATIVAN) injection 0.5 mg (0.5 mg Intravenous Given 09/29/18 0515)    Mobility walks Low fall risk   Focused Assessments Cardiac Assessment Handoff:    No results found for: CKTOTAL, CKMB, CKMBINDEX, TROPONINI No results found for: DDIMER Does the Patient currently have chest pain? No      R Recommendations: See Admitting Provider Note  Report given to: Para MarchJeanette RN  Additional Notes:

## 2018-09-29 NOTE — ED Provider Notes (Signed)
[facility] Emergency Department Provider Note    First MD Initiated Contact with Patient 09/29/18 0130     (approximate)  I have reviewed the triage vital signs and the nursing notes.   HISTORY  Chief Complaint Abdominal Pain and Jaundice   HPI Kayla Edwards is a 23 y.o. female with history of GERD and recently diagnosed cholelithiasis on 09/27/2018 returns to the emergency department secondary to yellowing of the eyes and dark urine which patient noted today.  Patient states that she has a follow-up appointment tomorrow with Dr. Celine Ahr of general surgery however as advised the patient return for the beforementioned symptoms.  Patient denies any pain at present.  Patient denies any fever.  No nausea or vomiting.        Past Medical History:  Diagnosis Date   GERD (gastroesophageal reflux disease)     There are no active problems to display for this patient.   History reviewed. No pertinent surgical history.  Prior to Admission medications   Medication Sig Start Date End Date Taking? Authorizing Provider  famotidine (PEPCID) 20 MG tablet Take 20 mg by mouth daily as needed for heartburn or indigestion.   Yes [provider]  ibuprofen (ADVIL) 200 MG tablet Take 200-400 mg by mouth every 6 (six) hours as needed for fever, headache or mild pain.   Yes [provider]  azithromycin (ZITHROMAX Z-PAK) 250 MG tablet 2 pills today then 1 pill a day for 4 days Patient not taking: Reported on 09/27/2018 02/25/18   Versie Starks, PA-C  benzonatate (TESSALON) 200 MG capsule Take 1 capsule (200 mg total) by mouth 3 (three) times daily as needed for cough. Patient not taking: Reported on 09/27/2018 02/25/18   Versie Starks, PA-C  ondansetron (ZOFRAN ODT) 4 MG disintegrating tablet Allow 1-2 tablets to dissolve in your mouth every 8 hours as needed for nausea/vomiting 09/27/18   Hinda Kehr, MD    Allergies Contrast media [iodinated diagnostic  agents]  No family history on file.  Social History Social History   Tobacco Use   Smoking status: Current Every Day Smoker    Types: Cigarettes   Smokeless tobacco: Never Used  Substance Use Topics   Alcohol use: Not Currently   Drug use: Not Currently    Review of Systems Constitutional: No fever/chills Eyes: No visual changes.  Positive for yellowing of the eyes ENT: No sore throat. Cardiovascular: Denies chest pain. Respiratory: Denies shortness of breath. Gastrointestinal: No abdominal pain, no nausea no diarrhea or constipation. Genitourinary: Negative for dysuria.  Positive for dark urine Musculoskeletal: Negative for neck pain.  Negative for back pain. Integumentary: Negative for rash. Neurological: Negative for headaches, focal weakness or numbness.   ____________________________________________   PHYSICAL EXAM:  VITAL SIGNS: ED Triage Vitals [09/28/18 2349]  Enc Vitals Group     BP (!) 141/86     Pulse Rate (!) 101     Resp 18     Temp 97.7 F (36.5 C)     Temp Source Oral     SpO2 98 %     Weight 72.6 kg (160 lb)     Height 1.651 m (5\' 5" )     Head Circumference      Peak Flow      Pain Score 1     Pain Loc      Pain Edu?      Excl. in [place]?     Constitutional: Alert and oriented.  Eyes:  Conjunctivae are normal.  Scleral icterus Mouth/Throat: Mucous membranes are moist. Neck: No stridor.  No meningeal signs.   Cardiovascular: Normal rate, regular rhythm. Good peripheral circulation. Grossly normal heart sounds. Respiratory: Normal respiratory effort.  No retractions. Gastrointestinal: Soft and nontender. No distention.  Musculoskeletal: No lower extremity tenderness nor edema. No gross deformities of extremities. Neurologic:  Normal speech and language. No gross focal neurologic deficits are appreciated.  Skin:  Skin is warm, dry and intact. Psychiatric: Mood and affect are normal. Speech and behavior are  normal.  ____________________________________________   LABS (all labs ordered are listed, but only abnormal results are displayed)  Labs Reviewed  COMPREHENSIVE METABOLIC PANEL - Abnormal; Notable for the following components:      Result Value   Glucose, Bld 128 (*)    AST 282 (*)    ALT 498 (*)    Alkaline Phosphatase 188 (*)    Total Bilirubin 5.5 (*)    All other components within normal limits  URINALYSIS, COMPLETE (UACMP) WITH MICROSCOPIC - Abnormal; Notable for the following components:   Color, Urine AMBER (*)    APPearance CLEAR (*)    Bilirubin Urine MODERATE (*)    Ketones, ur 20 (*)    Bacteria, UA RARE (*)    All other components within normal limits  SARS CORONAVIRUS 2 (HOSPITAL ORDER, PERFORMED IN  HOSPITAL LAB)  CBC  PREGNANCY, URINE  TYPE AND SCREEN    RADIOLOGY I, [place] N Kayla Edwards, personally viewed and evaluated these images (plain radiographs) as part of my medical decision making, as well as reviewing the written report by the radiologist.  ED MD interpretation: Cholelithiasis without any other acute findings per radiologist on CT abdomen and pelvis interpretation.  Official radiology report(s): Ct Abdomen Pelvis Wo Contrast  Result Date: 09/29/2018 CLINICAL DATA:  Epigastric pain and known cholelithiasis with increasing jaundice EXAM: CT ABDOMEN AND PELVIS WITHOUT CONTRAST TECHNIQUE: Multidetector CT imaging of the abdomen and pelvis was performed following the standard protocol without IV contrast. COMPARISON:  Ultrasound from 09/27/2018 FINDINGS: Lower chest: No acute abnormality. Hepatobiliary: Liver is well visualized without significant mass lesion. The gallbladder is well distended. The known gallstones are not well appreciated on this exam. No biliary ductal dilatation is seen. Pancreas: Unremarkable. No pancreatic ductal dilatation or surrounding inflammatory changes. Spleen: Normal in size without focal abnormality. Adrenals/Urinary  Tract: Adrenal glands are within normal limits. Kidneys are well visualized bilaterally. No renal calculi or obstructive changes are noted. Bladder is within normal limits. Stomach/Bowel: Stomach is within normal limits. Appendix appears normal. No evidence of bowel wall thickening, distention, or inflammatory changes. Vascular/Lymphatic: No significant vascular findings are present. No enlarged abdominal or pelvic lymph nodes. Reproductive: Uterus and bilateral adnexa are unremarkable. Other: No abdominal wall hernia or abnormality. No abdominopelvic ascites. Musculoskeletal: No acute or significant osseous findings. IMPRESSION: No acute abnormality noted. Known cholelithiasis is not as well appreciated on CT examination. The significant increase in liver function tests is likely related to hepatocellular dysfunction as no significant obstructive changes seen. Electronically Signed   By: Alcide CleverMark  Lukens M.D.   On: 09/29/2018 03:13    :  Procedures   ____________________________________________   INITIAL IMPRESSION / MDM / ASSESSMENT AND PLAN / ED COURSE  As part of my medical decision making, I reviewed the following data within the electronic MEDICAL RECORD NUMBER  23 year old female presenting with above-stated history and physical exam secondary to painless jaundice and scleral icterus.  CT scan of the abdomen  pelvis performed to evaluate for possible choledocholithiasis versus other obstructive etiology of the biliary tract.  CT revealed no evidence of choledocholithiasis or any other obstructing factors.  Patient's laboratory data notable for AST of 282 ALT of 498 which is considerably increased from 125 and 91 respectively on 09/27/2018.  In addition patient's bilirubin is increased from 0.5 on 09/26/2020 5.5 today.  Patient discussed with Dr. Everlene FarrierPabon general surgery who will consult on the patient.  Patient also discussed with Dr. Sheryle Haildiamond hospitalist for hospital admission for further evaluation and  management.  ____________________________________________  FINAL CLINICAL IMPRESSION(S) / ED DIAGNOSES  Final diagnoses:  Jaundice  Calculus of gallbladder without cholecystitis without obstruction     MEDICATIONS GIVEN DURING THIS VISIT:  Medications  LORazepam (ATIVAN) injection 0.5 mg (has no administration in time range)     ED Discharge Orders    None      *Please note:  Kayla HuddleCheyenne Obst was evaluated in Emergency Department on 09/29/2018 for the symptoms described in the history of present illness. She was evaluated in the context of the global COVID-19 pandemic, which necessitated consideration that the patient might be at risk for infection with the SARS-CoV-2 virus that causes COVID-19. Institutional protocols and algorithms that pertain to the evaluation of patients at risk for COVID-19 are in a state of rapid change based on information released by regulatory bodies including the CDC and federal and state organizations. These policies and algorithms were followed during the patient's care in the ED.  Some ED evaluations and interventions may be delayed as a result of limited staffing during the pandemic.*  Note:  This document was prepared using Dragon voice recognition software and may include unintentional dictation errors.   Darci CurrentBrown, [place] N, MD 09/29/18 [phone]

## 2018-09-29 NOTE — H&P (Signed)
Kayla Edwards is an 23 y.o. female.   Chief Complaint: Abdominal pain HPI: The patient with past medical history of GERD presents to the emergency department complaining of abdominal pain.  She has had multiple episodes of right upper quadrant pain over the last few days.  She was seen in the ED 2 days ago for the same complaints and was to follow-up with the surgical outpatient clinic.  However the patient noticed her eyes becoming yellow and her urine becoming dark which prompted her to seek evaluation in the emergency department where she was found to have hyperbilirubinemia as well as transaminitis.  CT scan was performed to evaluate for choledocholithiasis.  Imaging was negative.  Thus the emergency department staff call hospitalist service to further evaluate cholestasis.  Past Medical History:  Diagnosis Date  . GERD (gastroesophageal reflux disease)     History reviewed. No pertinent surgical history. None  No family history on file. Nothing the patient is awareof   Social History:  reports that she has been smoking cigarettes. She has never used smokeless tobacco. She reports previous alcohol use. She reports previous drug use.  Allergies:  Allergies  Allergen Reactions  . Contrast Media [Iodinated Diagnostic Agents]     Medications Prior to Admission  Medication Sig Dispense Refill  . famotidine (PEPCID) 20 MG tablet Take 20 mg by mouth daily as needed for heartburn or indigestion.    Marland Kitchen. ibuprofen (ADVIL) 200 MG tablet Take 200-400 mg by mouth every 6 (six) hours as needed for fever, headache or mild pain.    Marland Kitchen. azithromycin (ZITHROMAX Z-PAK) 250 MG tablet 2 pills today then 1 pill a day for 4 days (Patient not taking: Reported on 09/27/2018) 6 each 0  . benzonatate (TESSALON) 200 MG capsule Take 1 capsule (200 mg total) by mouth 3 (three) times daily as needed for cough. (Patient not taking: Reported on 09/27/2018) 30 capsule 0  . ondansetron (ZOFRAN ODT) 4 MG disintegrating tablet  Allow 1-2 tablets to dissolve in your mouth every 8 hours as needed for nausea/vomiting 30 tablet 0    Results for orders placed or performed during the hospital encounter of 09/29/18 (from the past 48 hour(s))  Comprehensive metabolic panel     Status: Abnormal   Collection Time: 09/29/18 12:11 AM  Result Value Ref Range   Sodium 137 135 - 145 mmol/L   Potassium 3.5 3.5 - 5.1 mmol/L   Chloride 104 98 - 111 mmol/L   CO2 23 22 - 32 mmol/L   Glucose, Bld 128 (H) 70 - 99 mg/dL   BUN 8 6 - 20 mg/dL   Creatinine, Ser 1.610.83 0.44 - 1.00 mg/dL   Calcium 9.8 8.9 - 09.610.3 mg/dL   Total Protein 8.0 6.5 - 8.1 g/dL   Albumin 4.5 3.5 - 5.0 g/dL   AST 045282 (H) 15 - 41 U/L   ALT 498 (H) 0 - 44 U/L   Alkaline Phosphatase 188 (H) 38 - 126 U/L   Total Bilirubin 5.5 (H) 0.3 - 1.2 mg/dL    Comment: RESULTS VERIFIED BY REPEAT TESTING SNG   GFR calc non Af Amer >60 >60 mL/min   GFR calc Af Amer >60 >60 mL/min   Anion gap 10 5 - 15    Comment: Performed at [facility], [address] [place], [place] [address]  CBC     Status: None   Collection Time: 09/29/18 12:11 AM  Result Value Ref Range   WBC 5.0 4.0 - 10.5  K/uL   RBC 4.72 3.87 - 5.11 MIL/uL   Hemoglobin 13.6 12.0 - 15.0 g/dL   HCT 40.7 36.0 - 46.0 %   MCV 86.2 80.0 - 100.0 fL   MCH 28.8 26.0 - 34.0 pg   MCHC 33.4 30.0 - 36.0 g/dL   RDW 13.1 11.5 - 15.5 %   Platelets 314 150 - 400 K/uL   nRBC 0.0 0.0 - 0.2 %    Comment: Performed at [facility], [address] [place], [place] [address]  Urinalysis, Complete w Microscopic     Status: Abnormal   Collection Time: 09/29/18 12:11 AM  Result Value Ref Range   Color, Urine AMBER (A) YELLOW    Comment: BIOCHEMICALS MAY BE AFFECTED BY COLOR   APPearance CLEAR (A) CLEAR   Specific Gravity, Urine 1.017 1.005 - 1.030   pH 6.0 5.0 - 8.0   Glucose, UA NEGATIVE NEGATIVE mg/dL   Hgb urine dipstick NEGATIVE NEGATIVE   Bilirubin Urine MODERATE (A) NEGATIVE   Ketones, ur 20 (A)  NEGATIVE mg/dL   Protein, ur NEGATIVE NEGATIVE mg/dL   Nitrite NEGATIVE NEGATIVE   Leukocytes,Ua NEGATIVE NEGATIVE   RBC / HPF 0-5 0 - 5 RBC/hpf   WBC, UA 6-10 0 - 5 WBC/hpf   Bacteria, UA RARE (A) NONE SEEN   Squamous Epithelial / LPF 6-10 0 - 5   Mucus PRESENT     Comment: Performed at [facility], [address] [place], [place] [address]  Pregnancy, urine     Status: None   Collection Time: 09/29/18 12:11 AM  Result Value Ref Range   Preg Test, Ur NEGATIVE NEGATIVE    Comment: Performed at [facility], [address] [place], [place] [address]  Type and screen [place]     Status: None   Collection Time: 09/29/18  1:34 AM  Result Value Ref Range   ABO/RH(D) A POS    Antibody Screen NEG    Sample Expiration      10/02/2018,2359 Performed at [facility], [address] [place], [place] [address]   SARS Coronavirus 2 [facility] order, Performed in [facility] hospital lab) Nasopharyngeal Nasopharyngeal Swab     Status: None   Collection Time: 09/29/18  1:34 AM   Specimen: Nasopharyngeal Swab  Result Value Ref Range   SARS Coronavirus 2 NEGATIVE NEGATIVE    Comment: (NOTE) If result is NEGATIVE SARS-CoV-2 target nucleic acids are NOT DETECTED. The SARS-CoV-2 RNA is generally detectable in upper and lower  respiratory specimens during the acute phase of infection. The lowest  concentration of SARS-CoV-2 viral copies this assay can detect is 250  copies / mL. A negative result does not preclude SARS-CoV-2 infection  and should not be used as the sole basis for treatment or other  patient management decisions.  A negative result may occur with  improper specimen collection / handling, submission of specimen other  than nasopharyngeal swab, presence of viral mutation(s) within the  areas targeted by this assay, and inadequate number of viral copies  (<250 copies / mL). A negative result must be combined with clinical   observations, patient history, and epidemiological information. If result is POSITIVE SARS-CoV-2 target nucleic acids are DETECTED. The SARS-CoV-2 RNA is generally detectable in upper and lower  respiratory specimens dur ing the acute phase of infection.  Positive  results are indicative of active infection with SARS-CoV-2.  Clinical  correlation with patient history and other diagnostic information is  necessary to determine patient infection status.  Positive results do  not rule out bacterial infection or co-infection with other viruses. If result is PRESUMPTIVE POSTIVE SARS-CoV-2 nucleic acids MAY BE PRESENT.   A presumptive positive result was obtained on the submitted specimen  and confirmed on repeat testing.  While 2019 novel coronavirus  (SARS-CoV-2) nucleic acids may be present in the submitted sample  additional confirmatory testing may be necessary for epidemiological  and / or clinical management purposes  to differentiate between  SARS-CoV-2 and other Sarbecovirus currently known to infect humans.  If clinically indicated additional testing with an alternate test  methodology [phone]([id number]) is advised. The SARS-CoV-2 RNA is generally  detectable in upper and lower respiratory sp ecimens during the acute  phase of infection. The expected result is Negative. Fact Sheet for Patients:  BoilerBrush.com.cyhttps://www.fda.gov/media/136312/download Fact Sheet for Healthcare Providers: https://pope.com/https://www.fda.gov/media/136313/download This test is not yet approved or cleared by the [place] FDA and has been authorized for detection and/or diagnosis of SARS-CoV-2 by FDA under an Emergency Use Authorization (EUA).  This EUA will remain in effect (meaning this test can be used) for the duration of the COVID-19 declaration under Section 564(b)(1) of the Act, 21 U.S.C. section 360bbb-3(b)(1), unless the authorization is terminated or revoked sooner. Performed at [facility], [address] [place], [place] [address]    Ct Abdomen Pelvis Wo Contrast  Result Date: 09/29/2018 CLINICAL DATA:  Epigastric pain and known cholelithiasis with increasing jaundice EXAM: CT ABDOMEN AND PELVIS WITHOUT CONTRAST TECHNIQUE: Multidetector CT imaging of the abdomen and pelvis was performed following the standard protocol without IV contrast. COMPARISON:  Ultrasound from 09/27/2018 FINDINGS: Lower chest: No acute abnormality. Hepatobiliary: Liver is well visualized without significant mass lesion. The gallbladder is well distended. The known gallstones are not well appreciated on this exam. No biliary ductal dilatation is seen. Pancreas: Unremarkable. No pancreatic ductal dilatation or surrounding inflammatory changes. Spleen: Normal in size without focal abnormality. Adrenals/Urinary Tract: Adrenal glands are within normal limits. Kidneys are well visualized bilaterally. No renal calculi or obstructive changes are noted. Bladder is within normal limits. Stomach/Bowel: Stomach is within normal limits. Appendix appears normal. No evidence of bowel wall thickening, distention, or inflammatory changes. Vascular/Lymphatic: No significant vascular findings are present. No enlarged abdominal or pelvic lymph nodes. Reproductive: Uterus and bilateral adnexa are unremarkable. Other: No abdominal wall hernia or abnormality. No abdominopelvic ascites. Musculoskeletal: No acute or significant osseous findings. IMPRESSION: No acute abnormality noted. Known cholelithiasis is not as well appreciated on CT examination. The significant increase in liver function tests is likely related to hepatocellular dysfunction as no significant obstructive changes seen. Electronically Signed   By: Alcide CleverMark  Lukens M.D.   On: 09/29/2018 03:13   [place] Abdomen Limited Ruq  Result Date: 09/27/2018 CLINICAL DATA:  Epigastric pain. EXAM: ULTRASOUND ABDOMEN LIMITED RIGHT UPPER QUADRANT COMPARISON:  Chest x-ray 02/25/2018. FINDINGS: Gallbladder: Gallstones  measuring up to 1.4 cm noted. Gallbladder wall thickness 2.1 mm. Negative Murphy sign. Common bile duct: Diameter: 4.8 mm Liver: No focal lesion identified. Within normal limits in parenchymal echogenicity. Portal vein is patent on color Doppler imaging with normal direction of blood flow towards the liver. Other: None. IMPRESSION: Multiple gallstones measuring up to 1.4 cm. No evidence of cholecystitis or biliary distention. Electronically Signed   By: Maisie Fushomas  Register   On: 09/27/2018 05:37    Review of Systems  Constitutional: Negative for chills and fever.  HENT: Negative for sore throat and tinnitus.  Eyes: Negative for blurred vision and redness.  Respiratory: Negative for cough and shortness of breath.   Cardiovascular: Negative for chest pain, palpitations, orthopnea and PND.  Gastrointestinal: Positive for abdominal pain. Negative for diarrhea, nausea and vomiting.  Genitourinary: Negative for dysuria, frequency and urgency.  Musculoskeletal: Negative for joint pain and myalgias.  Skin: Negative for rash.       No lesions  Neurological: Negative for speech change, focal weakness and weakness.  Endo/Heme/Allergies: Does not bruise/bleed easily.       No temperature intolerance  Psychiatric/Behavioral: Negative for depression and suicidal ideas.    Blood pressure 136/83, pulse 87, temperature 97.7 F (36.5 C), temperature source Oral, resp. rate 18, height 5\' 5"  (1.651 m), weight 72.6 kg, last menstrual period 09/13/2018, SpO2 98 %. Physical Exam  Vitals reviewed. Constitutional: She is oriented to person, place, and time. She appears well-developed and well-nourished. No distress.  HENT:  Head: Normocephalic and atraumatic.  Mouth/Throat: Oropharynx is clear and moist.  Eyes: Pupils are equal, round, and reactive to light. Conjunctivae and EOM are normal. No scleral icterus.  Neck: Normal range of motion. Neck supple. No JVD present. No tracheal deviation present. No thyromegaly  present.  Cardiovascular: Normal rate, regular rhythm and normal heart sounds. Exam reveals no gallop and no friction rub.  No murmur heard. Respiratory: Effort normal and breath sounds normal.  GI: Soft. Bowel sounds are normal. She exhibits no distension and no mass. There is abdominal tenderness. There is no rebound and no guarding.  Genitourinary:    Genitourinary Comments: Deferred   Musculoskeletal: Normal range of motion.        General: No edema.  Lymphadenopathy:    She has no cervical adenopathy.  Neurological: She is alert and oriented to person, place, and time. No cranial nerve deficit. She exhibits normal muscle tone.  Skin: Skin is warm and dry. No rash noted. No erythema.  Psychiatric: She has a normal mood and affect. Her behavior is normal. Judgment and thought content normal.     Assessment/Plan This is a 23 year old female admitted for jaundice. 1.  Cholestasis: With hyperbilirubinemia.  Associated transaminitis.  Concern for choledocholithiasis though none seen on CT scan.  Consult gastroenterology.  HIDA scan ordered.  Consider MRCP.  Also consult general surgery. 2.  Transaminitis: Obtain acute hepatitis panel to rule out infectious etiology. 3.  Overweight: BMI is 26.6; encourage healthy diet and exercise. 4.  DVT prophylaxis: Lovenox 6.  GI prophylaxis: H2 blocker per home regimen The patient is a full code.  Time spent on admission orders and patient care approximately 45 minutes  Arnaldo Nataliamond,  Chivon Lepage S, MD 09/29/2018, 5:07 AM

## 2018-09-29 NOTE — Progress Notes (Signed)
Patient briefly seen this morning.  Patient had CT scan and ultrasound admission.  Plan for MRCP today.  Patient seen by general surgery this morning.  Pending MRCP may need GI follow-up. Patient presented with abdominal pain and elevated LFTs. \  Tobacco dependence: Patient is encouraged to quit smoking and willing to attempt to quit was assessed. Patient highly motivated.Counseling was provided for 4 minutes. Nicotine patch ordered.  Ativan ordered for MRCP as patient reports she is claustrophobic.

## 2018-09-29 NOTE — Consult Note (Signed)
Cephas Darby, MD [address]  [place]  [place], [place] [address]  Main: [phone]  Fax: [phone] Pager: [phone]   Consultation  Referring Provider:     No ref. provider found Primary Care Physician:  Patient, No Pcp Per Primary Gastroenterologist: None         Reason for Consultation:     Evaluate for choledocholithiasis, elevated bilirubin  Date of Admission:  09/29/2018 Date of Consultation:  09/29/2018         HPI:   Kayla Edwards is a 23 y.o. female admitted with epigastric pain that occurred 2 days ago and jaundice.  Patient was initially in the ER on 8/4 secondary to sudden onset of epigastric pain that started after a fatty meal.  She had mildly elevated transaminases and ultrasound revealed cholelithiasis only with normal caliber CBD.  Therefore she was discharged to have follow-up with general surgery as outpatient.  However, due to yellowing of eyes and dark urine, she presented to ER yesterday.  She was found to have worsening LFTs AST 282, ALT 498, total bilirubin 5.5, alkaline phosphatase 188.  Patient underwent CT abdomen and pelvis without contrast which did not reveal any biliary obstruction.  Therefore GI and general surgery were consulted for further evaluation.  Patient is seen by general surgery, awaiting MRCP before proceeding with cholecystectomy.  Her lipase is mildly elevated.  She denies thinking alcohol.  She does smoke tobacco.  Patient denies fever, chills. Serum acetaminophen levels were undetectable, normal hemoglobin A1c, TSH.  SARS COVID-19 negative.  CBC unremarkable Patient returned from MRCP, anxious and tearful about next steps and she wants to be discharged soon as she has to take care of her grandparents who live 6hrs away  NSAIDs: None  Antiplts/Anticoagulants/Anti thrombotics: None  GI Procedures: None  Past Medical History:  Diagnosis Date   GERD (gastroesophageal reflux disease)     History reviewed. No pertinent  surgical history.  Prior to Admission medications   Medication Sig Start Date End Date Taking? Authorizing Provider  famotidine (PEPCID) 20 MG tablet Take 20 mg by mouth daily as needed for heartburn or indigestion.   Yes [provider]  ibuprofen (ADVIL) 200 MG tablet Take 200-400 mg by mouth every 6 (six) hours as needed for fever, headache or mild pain.   Yes [provider]  azithromycin (ZITHROMAX Z-PAK) 250 MG tablet 2 pills today then 1 pill a day for 4 days Patient not taking: Reported on 09/27/2018 02/25/18   Versie Starks, PA-C  benzonatate (TESSALON) 200 MG capsule Take 1 capsule (200 mg total) by mouth 3 (three) times daily as needed for cough. Patient not taking: Reported on 09/27/2018 02/25/18   Versie Starks, PA-C  ondansetron (ZOFRAN ODT) 4 MG disintegrating tablet Allow 1-2 tablets to dissolve in your mouth every 8 hours as needed for nausea/vomiting 09/27/18   Hinda Kehr, MD   Current Facility-Administered Medications:    0.9 %  sodium chloride infusion, , Intravenous, Continuous, Harrie Foreman, MD, Last Rate: 100 mL/hr at 09/29/18 0703   acetaminophen (TYLENOL) tablet 650 mg, 650 mg, Oral, Q6H PRN **OR** acetaminophen (TYLENOL) suppository 650 mg, 650 mg, Rectal, Q6H PRN, Harrie Foreman, MD   docusate sodium (COLACE) capsule 100 mg, 100 mg, Oral, BID, Harrie Foreman, MD, 100 mg at 09/29/18 1132   famotidine (PEPCID) tablet 20 mg, 20 mg, Oral, Daily PRN, Harrie Foreman, MD   morphine 2 MG/ML injection 1-2 mg,  1-2 mg, Intravenous, Q4H PRN, Harrie Foreman, MD   nicotine (NICODERM CQ - dosed in mg/24 hours) patch 21 mg, 21 mg, Transdermal, Daily, Mody, Sital, MD, 21 mg at 09/29/18 1131   ondansetron (ZOFRAN) tablet 4 mg, 4 mg, Oral, Q6H PRN **OR** ondansetron (ZOFRAN) injection 4 mg, 4 mg, Intravenous, Q6H PRN, Harrie Foreman, MD   prochlorperazine (COMPAZINE) injection 10 mg, 10 mg, Intravenous, Q6H PRN, Harrie Foreman,  MD   No family history on file.   Social History   Tobacco Use   Smoking status: Current Every Day Smoker    Types: Cigarettes   Smokeless tobacco: Never Used  Substance Use Topics   Alcohol use: Not Currently   Drug use: Not Currently    Allergies as of 09/28/2018 - Review Complete 09/28/2018  Allergen Reaction Noted   Contrast media [iodinated diagnostic agents]  09/28/2018    Review of Systems:    All systems reviewed and negative except where noted in HPI.   Physical Exam:  Vital signs in last 24 hours: Temp:  [97.7 F (36.5 C)-98.7 F (37.1 C)] 98.6 F (37 C) (08/06 1232) Pulse Rate:  [86-101] 98 (08/06 1232) Resp:  [18] 18 (08/06 1232) BP: (110-141)/(63-94) 110/63 (08/06 1232) SpO2:  [96 %-98 %] 96 % (08/06 1232) Weight:  [72.6 kg] 72.6 kg (08/05 2349) Last BM Date: 09/28/18 General:   Pleasant, cooperative in NAD Head:  Normocephalic and atraumatic. Eyes:   No icterus.   Conjunctiva pink. PERRLA. Ears:  Normal auditory acuity. Neck:  Supple; no masses or thyroidomegaly Lungs: Respirations even and unlabored. Lungs clear to auscultation bilaterally.   No wheezes, crackles, or rhonchi.  Heart:  Regular rate and rhythm;  Without murmur, clicks, rubs or gallops Abdomen:  Soft, nondistended, nontender. Normal bowel sounds. No appreciable masses or hepatomegaly.  No rebound or guarding.  Rectal:  Not performed. Msk:  Symmetrical without gross deformities.  Strength normal Extremities:  Without edema, cyanosis or clubbing. Neurologic:  Alert and oriented x3;  grossly normal neurologically. Skin:  Intact without significant lesions or rashes. Cervical Nodes:  No significant cervical adenopathy. Psych:  Alert and cooperative. Normal affect.  LAB RESULTS: CBC Latest Ref Rng & Units 09/29/2018 09/27/2018  WBC 4.0 - 10.5 K/uL 5.0 10.3  Hemoglobin 12.0 - 15.0 g/dL 13.6 13.3  Hematocrit 36.0 - 46.0 % 40.7 39.6  Platelets 150 - 400 K/uL 314 359    BMET BMP Latest  Ref Rng & Units 09/29/2018 09/27/2018  Glucose 70 - 99 mg/dL 128(H) 103(H)  BUN 6 - 20 mg/dL 8 7  Creatinine 0.44 - 1.00 mg/dL 0.83 0.71  Sodium 135 - 145 mmol/L 137 140  Potassium 3.5 - 5.1 mmol/L 3.5 3.8  Chloride 98 - 111 mmol/L 104 107  CO2 22 - 32 mmol/L 23 26  Calcium 8.9 - 10.3 mg/dL 9.8 9.5    LFT Hepatic Function Latest Ref Rng & Units 09/29/2018 09/27/2018  Total Protein 6.5 - 8.1 g/dL 8.0 7.5  Albumin 3.5 - 5.0 g/dL 4.5 4.3  AST 15 - 41 U/L 282(H) 125(H)  ALT 0 - 44 U/L 498(H) 91(H)  Alk Phosphatase 38 - 126 U/L 188(H) 104  Total Bilirubin 0.3 - 1.2 mg/dL 5.5(H) 0.5     STUDIES: Ct Abdomen Pelvis Wo Contrast  Result Date: 09/29/2018 CLINICAL DATA:  Epigastric pain and known cholelithiasis with increasing jaundice EXAM: CT ABDOMEN AND PELVIS WITHOUT CONTRAST TECHNIQUE: Multidetector CT imaging of the abdomen and pelvis was performed following  the standard protocol without IV contrast. COMPARISON:  Ultrasound from 09/27/2018 FINDINGS: Lower chest: No acute abnormality. Hepatobiliary: Liver is well visualized without significant mass lesion. The gallbladder is well distended. The known gallstones are not well appreciated on this exam. No biliary ductal dilatation is seen. Pancreas: Unremarkable. No pancreatic ductal dilatation or surrounding inflammatory changes. Spleen: Normal in size without focal abnormality. Adrenals/Urinary Tract: Adrenal glands are within normal limits. Kidneys are well visualized bilaterally. No renal calculi or obstructive changes are noted. Bladder is within normal limits. Stomach/Bowel: Stomach is within normal limits. Appendix appears normal. No evidence of bowel wall thickening, distention, or inflammatory changes. Vascular/Lymphatic: No significant vascular findings are present. No enlarged abdominal or pelvic lymph nodes. Reproductive: Uterus and bilateral adnexa are unremarkable. Other: No abdominal wall hernia or abnormality. No abdominopelvic ascites.  Musculoskeletal: No acute or significant osseous findings. IMPRESSION: No acute abnormality noted. Known cholelithiasis is not as well appreciated on CT examination. The significant increase in liver function tests is likely related to hepatocellular dysfunction as no significant obstructive changes seen. Electronically Signed   By: Inez Catalina M.D.   On: 09/29/2018 03:13   Mr Abdomen Mrcp Moise Boring Contast  Result Date: 09/29/2018 CLINICAL DATA:  Jaundice.  Known gallstones. EXAM: MRI ABDOMEN WITHOUT AND WITH CONTRAST (INCLUDING MRCP) TECHNIQUE: Multiplanar multisequence MR imaging of the abdomen was performed both before and after the administration of intravenous contrast. Heavily T2-weighted images of the biliary and pancreatic ducts were obtained, and three-dimensional MRCP images were rendered by post processing. CONTRAST:  7 cc Gadavist COMPARISON:  CT of earlier in the day.  Ultrasound of 09/27/2018. FINDINGS: Lower chest: Normal heart size without pericardial or pleural effusion. Hepatobiliary: Mild hepatic steatosis. No focal liver lesion. Multiple gallstones including at up to 1.0 cm. No evidence of acute cholecystitis. Mild intrahepatic biliary duct dilatation. The common duct is mildly dilated, including at maximally 9 mm in the porta hepatis on 42/11. Two tiny (1-2 mm) distal common duct stones are identified including on 37/11 and 9/13. Pancreas:  Normal, without mass or ductal dilatation. Spleen:  Normal in size, without focal abnormality. Adrenals/Urinary Tract: Normal adrenal glands. Normal kidneys, without hydronephrosis. Stomach/Bowel: Normal stomach and abdominal bowel loops. Vascular/Lymphatic: Normal caliber of the aorta and branch vessels. No abdominal adenopathy. Other:  No ascites. Musculoskeletal: No acute osseous abnormality. IMPRESSION: 1. Choledocholithiasis with mild biliary duct dilatation. 2. Cholelithiasis. 3. Mild hepatic steatosis. Electronically Signed   By: Abigail Miyamoto M.D.    On: 09/29/2018 15:20      Impression / Plan:   Kayla Edwards is a 23 y.o. female with symptomatic cholelithiasis, elevated LFTs.  Patient does not have cholangitis.  She does not have acute pancreatitis  Agree with MRCP.  If MRCP reveals biliary obstruction or patient continues to have worsening total bilirubin, patient needs to be transferred to [facility] for ERCP as Dr. Allen Norris is on vacation this week Coleman with [facility] Pain control IV fluids Monitor LFTs closely Acute viral hepatitis panel in process  Thank you for involving me in the care of this patient.      LOS: 0 days   Sherri Sear, MD  09/29/2018, 3:32 PM   Note: This dictation was prepared with Dragon dictation along with smaller phrase technology. Any transcriptional errors that result from this process are unintentional.

## 2018-09-29 NOTE — Discharge Summary (Signed)
[facility] [place] [facility] [address] [facility]   PATIENT NAME: Kayla HuddleCheyenne Edwards    MR#:  [id number]  DATE OF BIRTH:  05/14/1995  DATE OF ADMISSION:  09/29/2018 ADMITTING PHYSICIAN: Arnaldo NatalMichael S Diamond, MD  DATE OF DISCHARGE: 09/29/2018  PRIMARY CARE PHYSICIAN: Patient, No Pcp Per    ADMISSION DIAGNOSIS:  Jaundice [R17] Calculus of gallbladder without cholecystitis without obstruction [K80.20]  DISCHARGE DIAGNOSIS:  Active Problems:   Cholestatic jaundice   SECONDARY DIAGNOSIS:   Past Medical History:  Diagnosis Date  . GERD (gastroesophageal reflux disease)     HOSPITAL COURSE:  23 y/o female with tobacco dependence presented to ED with abdominal pain and jaundice.  1. Elevated LFTs with abdominal pain and jaundice: MRCP shows CBD stones. SHe will be transferred to [place] for further workup including ERCP.  2. Tobacco dependence: Patient is encouraged to quit smoking and willing to attempt to quit was assessed. Patient highly motivated.Counseling was provided for 4 minutes.    DISCHARGE CONDITIONS AND DIET:  stable NPO  CONSULTS OBTAINED:  Treatment Team:  Leafy RoPabon, Diego F, MD Toney ReilVanga, Rohini Reddy, MD  DRUG ALLERGIES:   Allergies  Allergen Reactions  . Contrast Media [Iodinated Diagnostic Agents]     DISCHARGE MEDICATIONS:   Allergies as of 09/29/2018      Reactions   Contrast Media [iodinated Diagnostic Agents]       Medication List    STOP taking these medications   azithromycin 250 MG tablet Commonly known as: Zithromax Z-Pak   benzonatate 200 MG capsule Commonly known as: TESSALON   famotidine 20 MG tablet Commonly known as: PEPCID   ibuprofen 200 MG tablet Commonly known as: ADVIL   ondansetron 4 MG disintegrating tablet Commonly known as: Zofran ODT     TAKE these medications   nicotine 21 mg/24hr patch Commonly known as: NICODERM CQ - dosed in mg/24 hours Place 1 patch (21 mg total) onto the skin daily. Start taking on: September 30, 2018         Today   CHIEF COMPLAINT:  Here with abdominal pain jaundice   VITAL SIGNS:  Blood pressure 110/63, pulse 98, temperature 98.6 F (37 C), temperature source Oral, resp. rate 18, height 5\' 5"  (1.651 m), weight 72.6 kg, last menstrual period 09/13/2018, SpO2 96 %.   REVIEW OF SYSTEMS:  Review of Systems  Constitutional: Positive for malaise/fatigue. Negative for chills and fever.  HENT: Negative.  Negative for ear discharge, ear pain, hearing loss, nosebleeds and sore throat.   Eyes: Negative for blurred vision and pain.       Icteric  Respiratory: Negative.  Negative for cough, hemoptysis, shortness of breath and wheezing.   Cardiovascular: Negative.  Negative for chest pain, palpitations and leg swelling.  Gastrointestinal: Positive for abdominal pain. Negative for blood in stool, diarrhea, nausea and vomiting.  Genitourinary: Negative.  Negative for dysuria.  Musculoskeletal: Negative.  Negative for back pain.  Skin:       jaundice  Neurological: Negative for dizziness, tremors, speech change, focal weakness, seizures and headaches.  Endo/Heme/Allergies: Negative.  Does not bruise/bleed easily.  Psychiatric/Behavioral: Negative.  Negative for depression, hallucinations and suicidal ideas.     PHYSICAL EXAMINATION:  GENERAL:  23 y.o.-year-old patient lying in the bed with no acute distress.  NECK:  Supple, no jugular venous distention. No thyroid enlargement, no tenderness.   =scleral incterus LUNGS: Normal breath sounds bilaterally, no wheezing, rales,rhonchi  No use of accessory muscles of respiration.  CARDIOVASCULAR: S1,  S2 normal. No murmurs, rubs, or gallops.  ABDOMEN: Soft, non-tender, non-distended. Bowel sounds present. No organomegaly or mass.  EXTREMITIES: No pedal edema, cyanosis, or clubbing.  PSYCHIATRIC: The patient is alert and oriented x 3.  SKIN: No obvious rash, lesion, or ulcer.  =mild jaudice  DATA REVIEW:   CBC Recent Labs   Lab 09/29/18 0011  WBC 5.0  HGB 13.6  HCT 40.7  PLT 314    Chemistries  Recent Labs  Lab 09/29/18 0011  NA 137  K 3.5  CL 104  CO2 23  GLUCOSE 128*  BUN 8  CREATININE 0.83  CALCIUM 9.8  AST 282*  ALT 498*  ALKPHOS 188*  BILITOT 5.5*    Cardiac Enzymes No results for input(s): TROPONINI in the last 168 hours.  Microbiology Results  @[email or web address] @  RADIOLOGY:  Ct Abdomen Pelvis Wo Contrast  Result Date: 09/29/2018 CLINICAL DATA:  Epigastric pain and known cholelithiasis with increasing jaundice EXAM: CT ABDOMEN AND PELVIS WITHOUT CONTRAST TECHNIQUE: Multidetector CT imaging of the abdomen and pelvis was performed following the standard protocol without IV contrast. COMPARISON:  Ultrasound from 09/27/2018 FINDINGS: Lower chest: No acute abnormality. Hepatobiliary: Liver is well visualized without significant mass lesion. The gallbladder is well distended. The known gallstones are not well appreciated on this exam. No biliary ductal dilatation is seen. Pancreas: Unremarkable. No pancreatic ductal dilatation or surrounding inflammatory changes. Spleen: Normal in size without focal abnormality. Adrenals/Urinary Tract: Adrenal glands are within normal limits. Kidneys are well visualized bilaterally. No renal calculi or obstructive changes are noted. Bladder is within normal limits. Stomach/Bowel: Stomach is within normal limits. Appendix appears normal. No evidence of bowel wall thickening, distention, or inflammatory changes. Vascular/Lymphatic: No significant vascular findings are present. No enlarged abdominal or pelvic lymph nodes. Reproductive: Uterus and bilateral adnexa are unremarkable. Other: No abdominal wall hernia or abnormality. No abdominopelvic ascites. Musculoskeletal: No acute or significant osseous findings. IMPRESSION: No acute abnormality noted. Known cholelithiasis is not as well appreciated on CT examination. The significant increase in liver function tests is likely  related to hepatocellular dysfunction as no significant obstructive changes seen. Electronically Signed   By: Alcide CleverMark  Lukens M.D.   On: 09/29/2018 03:13   Mr Abdomen Mrcp Vivien RossettiW Wo Contast  Result Date: 09/29/2018 CLINICAL DATA:  Jaundice.  Known gallstones. EXAM: MRI ABDOMEN WITHOUT AND WITH CONTRAST (INCLUDING MRCP) TECHNIQUE: Multiplanar multisequence MR imaging of the abdomen was performed both before and after the administration of intravenous contrast. Heavily T2-weighted images of the biliary and pancreatic ducts were obtained, and three-dimensional MRCP images were rendered by post processing. CONTRAST:  7 cc Gadavist COMPARISON:  CT of earlier in the day.  Ultrasound of 09/27/2018. FINDINGS: Lower chest: Normal heart size without pericardial or pleural effusion. Hepatobiliary: Mild hepatic steatosis. No focal liver lesion. Multiple gallstones including at up to 1.0 cm. No evidence of acute cholecystitis. Mild intrahepatic biliary duct dilatation. The common duct is mildly dilated, including at maximally 9 mm in the porta hepatis on 42/11. Two tiny (1-2 mm) distal common duct stones are identified including on 37/11 and 9/13. Pancreas:  Normal, without mass or ductal dilatation. Spleen:  Normal in size, without focal abnormality. Adrenals/Urinary Tract: Normal adrenal glands. Normal kidneys, without hydronephrosis. Stomach/Bowel: Normal stomach and abdominal bowel loops. Vascular/Lymphatic: Normal caliber of the aorta and branch vessels. No abdominal adenopathy. Other:  No ascites. Musculoskeletal: No acute osseous abnormality. IMPRESSION: 1. Choledocholithiasis with mild biliary duct dilatation. 2. Cholelithiasis. 3. Mild hepatic steatosis. Electronically Signed  By: Abigail Miyamoto M.D.   On: 09/29/2018 15:20      Allergies as of 09/29/2018      Reactions   Contrast Media [iodinated Diagnostic Agents]       Medication List    STOP taking these medications   azithromycin 250 MG tablet Commonly known  as: Zithromax Z-Pak   benzonatate 200 MG capsule Commonly known as: TESSALON   famotidine 20 MG tablet Commonly known as: PEPCID   ibuprofen 200 MG tablet Commonly known as: ADVIL   ondansetron 4 MG disintegrating tablet Commonly known as: Zofran ODT     TAKE these medications   nicotine 21 mg/24hr patch Commonly known as: NICODERM CQ - dosed in mg/24 hours Place 1 patch (21 mg total) onto the skin daily. Start taking on: September 30, 2018         Management plans discussed with the patient and she is in agreement. Stable for discharge [place]  Patient should follow up with GI  CODE STATUS:     Code Status Orders  (From admission, onward)         Start     Ordered   09/29/18 0641  Full code  Continuous     09/29/18 0640        Code Status History    This patient has a current code status but no historical code status.   Advance Care Planning Activity      TOTAL TIME TAKING CARE OF THIS PATIENT: 45 minutes.    Note: This dictation was prepared with Dragon dictation along with smaller phrase technology. Any transcriptional errors that result from this process are unintentional.  Bettey Costa M.D on 09/29/2018 at 4:35 PM  Between 7am to 6pm - Pager - [phone] After 6pm go to [email or web address] - password [email or web address] [facility]  Office  [phone]  CC: Primary care physician; Patient, No Pcp Per

## 2018-09-29 NOTE — H&P (Signed)
History and Physical    Kayla Edwards WUX:[id number] DOB: 02-Jan-1996 DOA: 09/29/2018  PCP: Patient, No Pcp Per  Patient coming from: [facility]  I have personally briefly reviewed patient's old medical records in [place]  Chief Complaint: Abdominal pain  HPI: Kayla Edwards is a 23 y.o. female with medical history significant for GERD and tobacco use who presented to the [facility] ED for evaluation of abdominal pain.  Patient was initially seen at [facility] ED on 09/27/2018 felt was severe attack of her acid reflux.  She had a RUQ abdominal ultrasound performed which showed multiple gallstones up to 1.4 cm without evidence of cholecystitis or biliary distention.  CBD diameter was 4.8 mm.  She returned on 09/29/2018 after she noticed yellowing of her eyes and orange discoloration of her urine.  She was not having much pain or nausea/vomiting.  [facility] course:  Vitals on discharge showed BP 128/85, pulse 84, RR 18, temp 97.3 Fahrenheit, SPO2 98% on room air.  Labs were notable for AST 282, ALT 498, alk phos 188, total bilirubin 5.5, BUN 8, creatinine 0.83, WBC 5.0, hemoglobin 13.6, platelets 314,000, TSH 2.002 hemoglobin A1c 4.9%, acetaminophen level undetectable.  Acute hepatitis panel was obtained and pending.  Urinalysis was negative for UTI and urine pregnancy test was negative.  SARS-CoV-2 test was negative on 09/29/2018.  CT abdomen/pelvis without contrast was obtained which did not show acute abnormality.  General surgery were consulted and were concerned about choledocholithiasis.  GI were consulted and recommended MRCP.  MRCP was obtained and showed choledocholithiasis with mild biliary duct dilatation and cholelithiasis.  Mild hepatic steatosis also noted.  Due to no availability to perform ERCP at [facility], patient was transferred to [facility].   Review of Systems: All systems reviewed and are negative except as documented in history of present illness above.   Past Medical History:   Diagnosis Date  . Depression    @ 45yr of age was taking prozac- no insurance quit taking  . GERD (gastroesophageal reflux disease)     History reviewed. No pertinent surgical history.  Social History:  reports that she has been smoking cigarettes. She has been smoking about 1.00 pack per day. She has never used smokeless tobacco. She reports previous alcohol use. She reports previous drug use.  Allergies  Allergen Reactions  . Contrast Media [Iodinated Diagnostic Agents]     Family History  Problem Relation Age of Onset  . Diabetes Maternal Grandmother      Prior to Admission medications   Medication Sig Start Date End Date Taking? Authorizing Provider  nicotine (NICODERM CQ - DOSED IN MG/24 HOURS) 21 mg/24hr patch Place 1 patch (21 mg total) onto the skin daily. 09/30/18   MBettey Costa MD    Physical Exam: There were no vitals filed for this visit.  Constitutional: Resting in bed, NAD, calm, comfortable Eyes: PERRL, lids and conjunctivae normal, no scleral icterus present ENMT: Mucous membranes are moist. Posterior pharynx clear of any exudate or lesions.Normal dentition.  Neck: normal, supple, no masses. Respiratory: clear to auscultation bilaterally, no wheezing, no crackles. Normal respiratory effort. No accessory muscle use.  Cardiovascular: Regular rate and rhythm, no murmurs / rubs / gallops. No extremity edema. 2+ pedal pulses. Abdomen: no tenderness, no masses palpated. No hepatosplenomegaly. Bowel sounds positive.  Musculoskeletal: no clubbing / cyanosis. No joint deformity upper and lower extremities. Good ROM, no contractures. Normal muscle tone.  Skin: no rashes, lesions, ulcers. No induration Neurologic: CN 2-12 grossly intact. Sensation intact,  Strength 5/5 in all 4.  Psychiatric: Normal judgment and insight. Alert and oriented x 3. Normal mood.     Labs on Admission: I have personally reviewed following labs and imaging studies  CBC: Recent Labs  Lab  09/27/18 0231 09/29/18 0011  WBC 10.3 5.0  NEUTROABS 7.2  --   HGB 13.3 13.6  HCT 39.6 40.7  MCV 86.5 86.2  PLT 359 007   Basic Metabolic Panel: Recent Labs  Lab 09/27/18 0231 09/29/18 0011  NA 140 137  K 3.8 3.5  CL 107 104  CO2 26 23  GLUCOSE 103* 128*  BUN 7 8  CREATININE 0.71 0.83  CALCIUM 9.5 9.8   GFR: Estimated Creatinine Clearance: 107.9 mL/min (by C-G formula based on SCr of 0.83 mg/dL). Liver Function Tests: Recent Labs  Lab 09/27/18 0231 09/29/18 0011  AST 125* 282*  ALT 91* 498*  ALKPHOS 104 188*  BILITOT 0.5 5.5*  PROT 7.5 8.0  ALBUMIN 4.3 4.5   Recent Labs  Lab 09/27/18 0231  LIPASE 54*   No results for input(s): AMMONIA in the last 168 hours. Coagulation Profile: No results for input(s): INR, PROTIME in the last 168 hours. Cardiac Enzymes: No results for input(s): CKTOTAL, CKMB, CKMBINDEX, TROPONINI in the last 168 hours. BNP (last 3 results) No results for input(s): PROBNP in the last 8760 hours. HbA1C: Recent Labs    09/29/18 0701  HGBA1C 4.9   CBG: No results for input(s): GLUCAP in the last 168 hours. Lipid Profile: No results for input(s): CHOL, HDL, LDLCALC, TRIG, CHOLHDL, LDLDIRECT in the last 72 hours. Thyroid Function Tests: Recent Labs    09/29/18 0701  TSH 2.002   Anemia Panel: No results for input(s): VITAMINB12, FOLATE, FERRITIN, TIBC, IRON, RETICCTPCT in the last 72 hours. Urine analysis:    Component Value Date/Time   COLORURINE AMBER (A) 09/29/2018 0011   APPEARANCEUR CLEAR (A) 09/29/2018 0011   LABSPEC 1.017 09/29/2018 0011   PHURINE 6.0 09/29/2018 0011   GLUCOSEU NEGATIVE 09/29/2018 0011   HGBUR NEGATIVE 09/29/2018 0011   BILIRUBINUR MODERATE (A) 09/29/2018 0011   KETONESUR 20 (A) 09/29/2018 0011   PROTEINUR NEGATIVE 09/29/2018 0011   NITRITE NEGATIVE 09/29/2018 0011   LEUKOCYTESUR NEGATIVE 09/29/2018 0011    Radiological Exams on Admission: Ct Abdomen Pelvis Wo Contrast  Result Date: 09/29/2018  CLINICAL DATA:  Epigastric pain and known cholelithiasis with increasing jaundice EXAM: CT ABDOMEN AND PELVIS WITHOUT CONTRAST TECHNIQUE: Multidetector CT imaging of the abdomen and pelvis was performed following the standard protocol without IV contrast. COMPARISON:  Ultrasound from 09/27/2018 FINDINGS: Lower chest: No acute abnormality. Hepatobiliary: Liver is well visualized without significant mass lesion. The gallbladder is well distended. The known gallstones are not well appreciated on this exam. No biliary ductal dilatation is seen. Pancreas: Unremarkable. No pancreatic ductal dilatation or surrounding inflammatory changes. Spleen: Normal in size without focal abnormality. Adrenals/Urinary Tract: Adrenal glands are within normal limits. Kidneys are well visualized bilaterally. No renal calculi or obstructive changes are noted. Bladder is within normal limits. Stomach/Bowel: Stomach is within normal limits. Appendix appears normal. No evidence of bowel wall thickening, distention, or inflammatory changes. Vascular/Lymphatic: No significant vascular findings are present. No enlarged abdominal or pelvic lymph nodes. Reproductive: Uterus and bilateral adnexa are unremarkable. Other: No abdominal wall hernia or abnormality. No abdominopelvic ascites. Musculoskeletal: No acute or significant osseous findings. IMPRESSION: No acute abnormality noted. Known cholelithiasis is not as well appreciated on CT examination. The significant increase in liver function tests  is likely related to hepatocellular dysfunction as no significant obstructive changes seen. Electronically Signed   By: Inez Catalina M.D.   On: 09/29/2018 03:13   Mr Abdomen Mrcp Moise Boring Contast  Result Date: 09/29/2018 CLINICAL DATA:  Jaundice.  Known gallstones. EXAM: MRI ABDOMEN WITHOUT AND WITH CONTRAST (INCLUDING MRCP) TECHNIQUE: Multiplanar multisequence MR imaging of the abdomen was performed both before and after the administration of intravenous  contrast. Heavily T2-weighted images of the biliary and pancreatic ducts were obtained, and three-dimensional MRCP images were rendered by post processing. CONTRAST:  7 cc Gadavist COMPARISON:  CT of earlier in the day.  Ultrasound of 09/27/2018. FINDINGS: Lower chest: Normal heart size without pericardial or pleural effusion. Hepatobiliary: Mild hepatic steatosis. No focal liver lesion. Multiple gallstones including at up to 1.0 cm. No evidence of acute cholecystitis. Mild intrahepatic biliary duct dilatation. The common duct is mildly dilated, including at maximally 9 mm in the porta hepatis on 42/11. Two tiny (1-2 mm) distal common duct stones are identified including on 37/11 and 9/13. Pancreas:  Normal, without mass or ductal dilatation. Spleen:  Normal in size, without focal abnormality. Adrenals/Urinary Tract: Normal adrenal glands. Normal kidneys, without hydronephrosis. Stomach/Bowel: Normal stomach and abdominal bowel loops. Vascular/Lymphatic: Normal caliber of the aorta and branch vessels. No abdominal adenopathy. Other:  No ascites. Musculoskeletal: No acute osseous abnormality. IMPRESSION: 1. Choledocholithiasis with mild biliary duct dilatation. 2. Cholelithiasis. 3. Mild hepatic steatosis. Electronically Signed   By: Abigail Miyamoto M.D.   On: 09/29/2018 15:20    EKG: Not performed.  Assessment/Plan Active Problems:   Cholelithiasis with choledocholithiasis   Tobacco use  Kayla Edwards is a 23 y.o. female with medical history significant for GERD and tobacco use who is admitted with choledocholithiasis.   Choledocholithiasis and cholelithiasis: Seen on MRCP 09/29/2018.  Patient currently hemodynamically stable without suspicion for cholangitis or pancreatitis.  Pain is controlled. -Please consult GI in a.m. for potential ERCP -Will also need eventual cholecystectomy -Continue IV fluids -Will keep n.p.o. at midnight -Continue as needed pain control and antiemetics  Tobacco use:  Smoking 1 PPD.  She is motivated to quit and feels that the nicotine patch is beneficial.  DVT prophylaxis: SCDs Code Status: Full code, confirmed with patient Family Communication: None present on admission Disposition Plan: Pending clinical progress Consults called: None, will need GI consultation and possibly general surgery as well Admission status: Inpatient for management of choledocholithiasis and cholelithiasis.   Zada Finders MD [facility]  If 7PM-7AM, please contact night-coverage [email or web address]  09/30/2018, 12:44 AM

## 2018-09-30 ENCOUNTER — Encounter (HOSPITAL_COMMUNITY): Admission: AD | Disposition: A | Discharge status: Home / Self Care | Payer: Self-pay | Attending: Internal Medicine

## 2018-09-30 ENCOUNTER — Inpatient Hospital Stay (HOSPITAL_COMMUNITY): Payer: Medicaid Other | Admitting: Anesthesiology

## 2018-09-30 ENCOUNTER — Encounter (HOSPITAL_COMMUNITY): Payer: Self-pay | Admitting: Physician Assistant

## 2018-09-30 ENCOUNTER — Inpatient Hospital Stay (HOSPITAL_COMMUNITY): Payer: Medicaid Other

## 2018-09-30 DIAGNOSIS — K8050 Calculus of bile duct without cholangitis or cholecystitis without obstruction: Secondary | ICD-10-CM

## 2018-09-30 DIAGNOSIS — R945 Abnormal results of liver function studies: Secondary | ICD-10-CM

## 2018-09-30 DIAGNOSIS — R933 Abnormal findings on diagnostic imaging of other parts of digestive tract: Secondary | ICD-10-CM

## 2018-09-30 DIAGNOSIS — Z720 Tobacco use: Secondary | ICD-10-CM | POA: Diagnosis present

## 2018-09-30 DIAGNOSIS — K831 Obstruction of bile duct: Secondary | ICD-10-CM

## 2018-09-30 DIAGNOSIS — K3189 Other diseases of stomach and duodenum: Secondary | ICD-10-CM

## 2018-09-30 HISTORY — PX: BIOPSY: SHX5522

## 2018-09-30 HISTORY — PX: SPHINCTEROTOMY: SHX5544

## 2018-09-30 HISTORY — PX: REMOVAL OF STONES: SHX5545

## 2018-09-30 HISTORY — PX: ERCP: SHX5425

## 2018-09-30 LAB — COMPREHENSIVE METABOLIC PANEL
ALT: 337 U/L — ABNORMAL HIGH (ref 0–44)
AST: 167 U/L — ABNORMAL HIGH (ref 15–41)
Albumin: 3.5 g/dL (ref 3.5–5.0)
Alkaline Phosphatase: 150 U/L — ABNORMAL HIGH (ref 38–126)
Anion gap: 11 (ref 5–15)
BUN: 6 mg/dL (ref 6–20)
CO2: 19 mmol/L — ABNORMAL LOW (ref 22–32)
Calcium: 9 mg/dL (ref 8.9–10.3)
Chloride: 108 mmol/L (ref 98–111)
Creatinine, Ser: 0.73 mg/dL (ref 0.44–1.00)
GFR calc Af Amer: >60 mL/min (ref >60)
GFR calc non Af Amer: >60 mL/min (ref >60)
Glucose, Bld: 81 mg/dL (ref 70–99)
Potassium: 3.9 mmol/L (ref 3.5–5.1)
Sodium: 138 mmol/L (ref 135–145)
Total Bilirubin: 2.8 mg/dL — ABNORMAL HIGH (ref 0.3–1.2)
Total Protein: 6.3 g/dL — ABNORMAL LOW (ref 6.5–8.1)

## 2018-09-30 LAB — CBC
HCT: 37.4 % (ref 36.0–46.0)
Hemoglobin: 12.4 g/dL (ref 12.0–15.0)
MCH: 29.5 pg (ref 26.0–34.0)
MCHC: 33.2 g/dL (ref 30.0–36.0)
MCV: 88.8 fL (ref 80.0–100.0)
Platelets: 289 10*3/uL (ref 150–400)
RBC: 4.21 MIL/uL (ref 3.87–5.11)
RDW: 13.1 % (ref 11.5–15.5)
WBC: 4.9 10*3/uL (ref 4.0–10.5)
nRBC: 0 % (ref 0.0–0.2)

## 2018-09-30 LAB — HEPATITIS PANEL, ACUTE
HCV Ab: 0.1 s/co ratio (ref 0.0–0.9)
Hep A IgM: NEGATIVE
Hep B C IgM: NEGATIVE
Hepatitis B Surface Ag: NEGATIVE

## 2018-09-30 LAB — TYPE AND SCREEN
ABO/RH(D): A POS
Antibody Screen: NEGATIVE

## 2018-09-30 LAB — ABO/RH: ABO/RH(D): A POS

## 2018-09-30 LAB — HIV ANTIBODY (ROUTINE TESTING W REFLEX): HIV Screen 4th Generation wRfx: NONREACTIVE

## 2018-09-30 SURGERY — ERCP, WITH INTERVENTION IF INDICATED
Anesthesia: General

## 2018-09-30 MED ORDER — FENTANYL CITRATE (PF) 250 MCG/5ML IJ SOLN
INTRAMUSCULAR | Status: DC | PRN
  Administered 2018-09-30: 100 ug via INTRAVENOUS

## 2018-09-30 MED ORDER — DEXAMETHASONE SODIUM PHOSPHATE 10 MG/ML IJ SOLN
INTRAMUSCULAR | Status: DC | PRN
  Administered 2018-09-30: 5 mg via INTRAVENOUS

## 2018-09-30 MED ORDER — PHENOL 1.4 % MT LIQD: 1.0000 | OROMUCOSAL | Status: DC | PRN

## 2018-09-30 MED ORDER — SUGAMMADEX SODIUM 200 MG/2ML IV SOLN
INTRAVENOUS | Status: DC | PRN
  Administered 2018-09-30: 145.2 mg via INTRAVENOUS

## 2018-09-30 MED ORDER — CIPROFLOXACIN IN D5W 400 MG/200ML IV SOLN
INTRAVENOUS | Status: AC
  Filled 2018-09-30: qty 200

## 2018-09-30 MED ORDER — ROCURONIUM BROMIDE 10 MG/ML (PF) SYRINGE
PREFILLED_SYRINGE | INTRAVENOUS | Status: DC | PRN
  Administered 2018-09-30: 50 mg via INTRAVENOUS

## 2018-09-30 MED ORDER — METHYLPREDNISOLONE SODIUM SUCC 125 MG IJ SOLR
60.0000 mg | Freq: Once | INTRAMUSCULAR | Status: AC
  Administered 2018-09-30: 15:00:00 60 mg via INTRAVENOUS

## 2018-09-30 MED ORDER — DIPHENHYDRAMINE HCL 50 MG/ML IJ SOLN
INTRAMUSCULAR | Status: DC | PRN
  Administered 2018-09-30: 50 mg via INTRAVENOUS

## 2018-09-30 MED ORDER — DIPHENHYDRAMINE HCL 25 MG PO CAPS
25.0000 mg | ORAL_CAPSULE | Freq: Three times a day (TID) | ORAL | Status: AC
  Administered 2018-09-30: 23:00:00 25 mg via ORAL
  Filled 2018-09-30: qty 1

## 2018-09-30 MED ORDER — INDOMETHACIN 50 MG RE SUPP
RECTAL | Status: DC | PRN
  Administered 2018-09-30: 100 mg via RECTAL

## 2018-09-30 MED ORDER — MIDAZOLAM HCL 2 MG/2ML IJ SOLN
INTRAMUSCULAR | Status: DC | PRN
  Administered 2018-09-30: 2 mg via INTRAVENOUS

## 2018-09-30 MED ORDER — LIDOCAINE 2% (20 MG/ML) 5 ML SYRINGE
INTRAMUSCULAR | Status: DC | PRN
  Administered 2018-09-30: 80 mg via INTRAVENOUS

## 2018-09-30 MED ORDER — ALBUTEROL SULFATE HFA 108 (90 BASE) MCG/ACT IN AERS
INHALATION_SPRAY | RESPIRATORY_TRACT | Status: DC | PRN
  Administered 2018-09-30: 6 via RESPIRATORY_TRACT

## 2018-09-30 MED ORDER — DIPHENHYDRAMINE HCL 50 MG/ML IJ SOLN
INTRAMUSCULAR | Status: AC
  Filled 2018-09-30: qty 1

## 2018-09-30 MED ORDER — MENTHOL 3 MG MT LOZG: 1.0000 | LOZENGE | OROMUCOSAL | Status: DC | PRN

## 2018-09-30 MED ORDER — HYDROMORPHONE HCL 1 MG/ML IJ SOLN: 0.2500 mg | INTRAMUSCULAR | Status: DC | PRN

## 2018-09-30 MED ORDER — PHENYLEPHRINE 40 MCG/ML (10ML) SYRINGE FOR IV PUSH (FOR BLOOD PRESSURE SUPPORT)
PREFILLED_SYRINGE | INTRAVENOUS | Status: DC | PRN
  Administered 2018-09-30: 120 ug via INTRAVENOUS

## 2018-09-30 MED ORDER — METHYLPREDNISOLONE SODIUM SUCC 125 MG IJ SOLR
60.0000 mg | Freq: Four times a day (QID) | INTRAMUSCULAR | Status: AC
  Administered 2018-09-30 – 2018-10-01 (×2): 60 mg via INTRAVENOUS
  Filled 2018-09-30 (×2): qty 2

## 2018-09-30 MED ORDER — INDOMETHACIN 50 MG RE SUPP
RECTAL | Status: AC
  Filled 2018-09-30: qty 2

## 2018-09-30 MED ORDER — GLUCAGON HCL RDNA (DIAGNOSTIC) 1 MG IJ SOLR
INTRAMUSCULAR | Status: AC
  Filled 2018-09-30: qty 1

## 2018-09-30 MED ORDER — INDOMETHACIN 50 MG RE SUPP
100.0000 mg | Freq: Once | RECTAL | Status: AC
  Administered 2018-09-30: 15:00:00 100 mg via RECTAL

## 2018-09-30 MED ORDER — LACTATED RINGERS IV SOLN
INTRAVENOUS | Status: DC
  Administered 2018-09-30 – 2018-10-01 (×2): via INTRAVENOUS

## 2018-09-30 MED ORDER — SODIUM CHLORIDE 0.9 % IV SOLN
INTRAVENOUS | Status: DC
  Administered 2018-09-30: 17:00:00 via INTRAVENOUS

## 2018-09-30 MED ORDER — CIPROFLOXACIN IN D5W 400 MG/200ML IV SOLN
400.0000 mg | Freq: Once | INTRAVENOUS | Status: AC
  Administered 2018-09-30: 400 mg via INTRAVENOUS

## 2018-09-30 MED ORDER — PROMETHAZINE HCL 25 MG/ML IJ SOLN: 6.2500 mg | INTRAMUSCULAR | Status: DC | PRN

## 2018-09-30 MED ORDER — SODIUM CHLORIDE 0.9 % IV SOLN
INTRAVENOUS | Status: DC | PRN
  Administered 2018-09-30: 15 mL

## 2018-09-30 MED ORDER — ONDANSETRON HCL 4 MG/2ML IJ SOLN
INTRAMUSCULAR | Status: DC | PRN
  Administered 2018-09-30: 4 mg via INTRAVENOUS

## 2018-09-30 MED ORDER — GLUCAGON HCL RDNA (DIAGNOSTIC) 1 MG IJ SOLR
INTRAMUSCULAR | Status: DC | PRN
  Administered 2018-09-30: 0.25 mg via INTRAVENOUS

## 2018-09-30 MED ORDER — DIPHENHYDRAMINE HCL 50 MG/ML IJ SOLN
50.0000 mg | Freq: Once | INTRAMUSCULAR | Status: AC
  Administered 2018-09-30: 15:00:00 50 mg via INTRAVENOUS

## 2018-09-30 MED ORDER — LACTATED RINGERS IV SOLN
INTRAVENOUS | Status: DC
  Administered 2018-09-30: 15:00:00 via INTRAVENOUS

## 2018-09-30 MED ORDER — HYDROCORTISONE NA SUCCINATE PF 100 MG IJ SOLR
INTRAMUSCULAR | Status: DC | PRN
  Administered 2018-09-30: 60 mg via INTRAVENOUS

## 2018-09-30 MED ORDER — PANTOPRAZOLE SODIUM 40 MG PO TBEC
40.0000 mg | DELAYED_RELEASE_TABLET | Freq: Every day | ORAL | Status: DC
  Administered 2018-09-30 – 2018-10-02 (×2): 40 mg via ORAL
  Filled 2018-09-30 (×2): qty 1

## 2018-09-30 MED ORDER — METHYLPREDNISOLONE SODIUM SUCC 125 MG IJ SOLR
INTRAMUSCULAR | Status: AC
  Filled 2018-09-30: qty 2

## 2018-09-30 MED ORDER — PROPOFOL 10 MG/ML IV BOLUS
INTRAVENOUS | Status: DC | PRN
  Administered 2018-09-30: 200 mg via INTRAVENOUS

## 2018-09-30 NOTE — Anesthesia Procedure Notes (Signed)
Procedure Name: Intubation Date/Time: 09/30/2018 3:05 PM Performed by: Alain Marion, CRNA Pre-anesthesia Checklist: Patient identified, Emergency Drugs available, Suction available and Patient being monitored Patient Re-evaluated:Patient Re-evaluated prior to induction Oxygen Delivery Method: Circle System Utilized Preoxygenation: Pre-oxygenation with 100% oxygen Induction Type: IV induction Ventilation: Mask ventilation without difficulty Laryngoscope Size: Miller and 2 Grade View: Grade I Tube type: Oral Tube size: 7.0 mm Number of attempts: 1 Airway Equipment and Method: Stylet and Oral airway Placement Confirmation: ETT inserted through vocal cords under direct vision,  positive ETCO2 and breath sounds checked- equal and bilateral Secured at: 21 cm Tube secured with: Tape Dental Injury: Teeth and Oropharynx as per pre-operative assessment

## 2018-09-30 NOTE — Anesthesia Postprocedure Evaluation (Signed)
Anesthesia Post Note  Patient: Kayla Edwards  Procedure(s) Performed: ENDOSCOPIC RETROGRADE CHOLANGIOPANCREATOGRAPHY (ERCP) (N/A ) SPHINCTEROTOMY REMOVAL OF STONES BIOPSY     Patient location during evaluation: PACU Anesthesia Type: General Level of consciousness: awake and alert and oriented Pain management: pain level controlled Vital Signs Assessment: post-procedure vital signs reviewed and stable Respiratory status: spontaneous breathing, nonlabored ventilation and respiratory function stable Cardiovascular status: blood pressure returned to baseline and stable Postop Assessment: no apparent nausea or vomiting Anesthetic complications: no    Last Vitals:  Vitals:   09/30/18 1632 09/30/18 1634  BP: (!) 159/86 (!) 158/84  Pulse: (!) 126 (!) 123  Resp: 16 16  Temp:    SpO2: 100% 98%    Last Pain:  Vitals:   09/30/18 1634  TempSrc:   PainSc: 0-No pain                 [name] A.

## 2018-09-30 NOTE — Consult Note (Signed)
Reason for Consult: Choledocholithiasis Referring Physician: Dr. [place]  Kayla Edwards is an 23 y.o. female.  HPI: Patient is a 23 year old female with history of depression and GERD, who comes in several days ago secondary to abdominal pain.  Patient states that she was previously seen in the ER secondary to epigastric abdominal pain that radiates to the back.  She states that this was after eating [place] food.  She states that she was discharged from the ER with normal laboratory studies.  Patient states that thereafter she noticed some scleral icterus and some darkening of her urine.  Secondary to the above-noted changes she presented to the ER for further evaluation.uPon evaluation patient underwent ultrasound, CT scan and MR CP, she was found to have gallstones as well as choledocholithiasis.  I did review her scans and ultrasound personally.  GI was consulted for further management of her choledocholithiasis.  Patient is scheduled to undergo ERCP today.  General surgery was consulted for cholecystectomy secondary to choledocholithiasis.  Patient's laboratory studies have been trending down.  Past Medical History:  Diagnosis Date  . Depression    @ 81yrs of age was taking prozac- no insurance quit taking  . GERD (gastroesophageal reflux disease)     Past Surgical History:  Procedure Laterality Date  . PILONIDAL CYST EXCISION  08/2018  . SUPERFICIAL LYMPH NODE BIOPSY / EXCISION  Around 23 years old   Benign right axillary "infected" node excision    Family History  Problem Relation Age of Onset  . Diabetes Maternal Grandmother     Social History:  reports that she has been smoking cigarettes. She has been smoking about 1.00 pack per day. She has never used smokeless tobacco. She reports previous alcohol use. She reports previous drug use.  Allergies:  Allergies  Allergen Reactions  . Contrast Media [Iodinated Diagnostic Agents]     Medications: I have reviewed the  patient's current medications.  Results for orders placed or performed during the hospital encounter of 09/29/18 (from the past 48 hour(s))  Comprehensive metabolic panel     Status: Abnormal   Collection Time: 09/30/18  2:49 AM  Result Value Ref Range   Sodium 138 135 - 145 mmol/L   Potassium 3.9 3.5 - 5.1 mmol/L   Chloride 108 98 - 111 mmol/L   CO2 19 (L) 22 - 32 mmol/L   Glucose, Bld 81 70 - 99 mg/dL   BUN 6 6 - 20 mg/dL   Creatinine, Ser 0.73 0.44 - 1.00 mg/dL   Calcium 9.0 8.9 - 10.3 mg/dL   Total Protein 6.3 (L) 6.5 - 8.1 g/dL   Albumin 3.5 3.5 - 5.0 g/dL   AST 167 (H) 15 - 41 U/L   ALT 337 (H) 0 - 44 U/L   Alkaline Phosphatase 150 (H) 38 - 126 U/L   Total Bilirubin 2.8 (H) 0.3 - 1.2 mg/dL   GFR calc non Af Amer >60 >60 mL/min   GFR calc Af Amer >60 >60 mL/min   Anion gap 11 5 - 15    Comment: Performed at [facility], [address] [place] [address]  CBC     Status: None   Collection Time: 09/30/18  2:49 AM  Result Value Ref Range   WBC 4.9 4.0 - 10.5 K/uL   RBC 4.21 3.87 - 5.11 MIL/uL   Hemoglobin 12.4 12.0 - 15.0 g/dL   HCT 37.4 36.0 - 46.0 %   MCV 88.8 80.0 - 100.0 fL  MCH 29.5 26.0 - 34.0 pg   MCHC 33.2 30.0 - 36.0 g/dL   RDW 40.913.1 81.111.5 - 91.415.5 %   Platelets 289 150 - 400 K/uL   nRBC 0.0 0.0 - 0.2 %    Comment: Performed at [facility], [address] [place], [place] [id number]    Ct Abdomen Pelvis Wo Contrast  Result Date: 09/29/2018 CLINICAL DATA:  Epigastric pain and known cholelithiasis with increasing jaundice EXAM: CT ABDOMEN AND PELVIS WITHOUT CONTRAST TECHNIQUE: Multidetector CT imaging of the abdomen and pelvis was performed following the standard protocol without IV contrast. COMPARISON:  Ultrasound from 09/27/2018 FINDINGS: Lower chest: No acute abnormality. Hepatobiliary: Liver is well visualized without significant mass lesion. The gallbladder is well distended. The known gallstones are not well appreciated on this exam. No  biliary ductal dilatation is seen. Pancreas: Unremarkable. No pancreatic ductal dilatation or surrounding inflammatory changes. Spleen: Normal in size without focal abnormality. Adrenals/Urinary Tract: Adrenal glands are within normal limits. Kidneys are well visualized bilaterally. No renal calculi or obstructive changes are noted. Bladder is within normal limits. Stomach/Bowel: Stomach is within normal limits. Appendix appears normal. No evidence of bowel wall thickening, distention, or inflammatory changes. Vascular/Lymphatic: No significant vascular findings are present. No enlarged abdominal or pelvic lymph nodes. Reproductive: Uterus and bilateral adnexa are unremarkable. Other: No abdominal wall hernia or abnormality. No abdominopelvic ascites. Musculoskeletal: No acute or significant osseous findings. IMPRESSION: No acute abnormality noted. Known cholelithiasis is not as well appreciated on CT examination. The significant increase in liver function tests is likely related to hepatocellular dysfunction as no significant obstructive changes seen. Electronically Signed   By: Alcide CleverMark  Lukens M.D.   On: 09/29/2018 03:13   Mr Abdomen Mrcp Vivien RossettiW Wo Contast  Result Date: 09/29/2018 CLINICAL DATA:  Jaundice.  Known gallstones. EXAM: MRI ABDOMEN WITHOUT AND WITH CONTRAST (INCLUDING MRCP) TECHNIQUE: Multiplanar multisequence MR imaging of the abdomen was performed both before and after the administration of intravenous contrast. Heavily T2-weighted images of the biliary and pancreatic ducts were obtained, and three-dimensional MRCP images were rendered by post processing. CONTRAST:  7 cc Gadavist COMPARISON:  CT of earlier in the day.  Ultrasound of 09/27/2018. FINDINGS: Lower chest: Normal heart size without pericardial or pleural effusion. Hepatobiliary: Mild hepatic steatosis. No focal liver lesion. Multiple gallstones including at up to 1.0 cm. No evidence of acute cholecystitis. Mild intrahepatic biliary duct  dilatation. The common duct is mildly dilated, including at maximally 9 mm in the porta hepatis on 42/11. Two tiny (1-2 mm) distal common duct stones are identified including on 37/11 and 9/13. Pancreas:  Normal, without mass or ductal dilatation. Spleen:  Normal in size, without focal abnormality. Adrenals/Urinary Tract: Normal adrenal glands. Normal kidneys, without hydronephrosis. Stomach/Bowel: Normal stomach and abdominal bowel loops. Vascular/Lymphatic: Normal caliber of the aorta and branch vessels. No abdominal adenopathy. Other:  No ascites. Musculoskeletal: No acute osseous abnormality. IMPRESSION: 1. Choledocholithiasis with mild biliary duct dilatation. 2. Cholelithiasis. 3. Mild hepatic steatosis. Electronically Signed   By: Jeronimo GreavesKyle  Talbot M.D.   On: 09/29/2018 15:20    Review of Systems  Constitutional: Negative for chills, fever and malaise/fatigue.  HENT: Negative for ear discharge, hearing loss and sore throat.   Eyes: Negative for blurred vision and discharge.  Respiratory: Negative for cough and shortness of breath.   Cardiovascular: Negative for chest pain, orthopnea and leg swelling.  Gastrointestinal: Positive for abdominal pain, nausea and vomiting. Negative for constipation, diarrhea and heartburn.  Musculoskeletal: Negative  for myalgias and neck pain.  Skin: Negative for itching and rash.  Neurological: Negative for dizziness, focal weakness, seizures and loss of consciousness.  Endo/Heme/Allergies: Negative for environmental allergies. Does not bruise/bleed easily.  Psychiatric/Behavioral: Negative for depression and suicidal ideas.  All other systems reviewed and are negative.  Blood pressure (!) 121/56, pulse 71, temperature 98.4 F (36.9 C), temperature source Oral, resp. rate 18, last menstrual period 09/13/2018, SpO2 99 %. Physical Exam  Constitutional: She is oriented to person, place, and time. Vital signs are normal. She appears well-developed and well-nourished.   Conversant No acute distress  Eyes: Lids are normal. No scleral icterus.  Scleral  icterus No lid lag Moist conjunctiva  Neck: No tracheal tenderness present. No thyromegaly present.  No cervical lymphadenopathy  Cardiovascular: Normal rate, regular rhythm and intact distal pulses.  No murmur heard. Respiratory: Effort normal and breath sounds normal. She has no wheezes. She has no rales.  GI: Soft. Bowel sounds are normal. She exhibits no distension. There is no hepatosplenomegaly. There is abdominal tenderness (RUQ/epigastru). There is no rebound and no guarding. No hernia.  Neurological: She is alert and oriented to person, place, and time.  Normal gait and station  Skin: Skin is warm. No rash noted. No cyanosis. Nails show no clubbing.  Normal skin turgor  Psychiatric: Judgment normal.  Appropriate affect    Assessment/Plan: 23 year old female with cholelithiasis Choledocholithiasis  1.  Patient to undergo ERCP today.  We will plan on lap chole likely Saturday by Dr. Cliffton AstersWhite. 2. All risks and benefits were discussed with the patient to generally include: infection, bleeding, possible need for post op ERCP, damage to the bile ducts, and bile leak. Alternatives were offered and described.  All questions were answered and the patient voiced understanding of the procedure and wishes to proceed at this point with a laparoscopic cholecystectomy   Axel Fillerrmando Ashlin Kreps 09/30/2018, 11:37 AM

## 2018-09-30 NOTE — Anesthesia Preprocedure Evaluation (Addendum)
Anesthesia Evaluation  Patient identified by MRN, date of birth, ID band Patient awake    Reviewed: Allergy & Precautions, NPO status , Patient's Chart, lab work & pertinent test results  History of Anesthesia Complications Negative for: history of anesthetic complications  Airway Mallampati: II  TM Distance: >3 FB Neck ROM: Full    Dental no notable dental hx. (+) Dental Advisory Given   Pulmonary neg pulmonary ROS, Current Smoker and Patient abstained from smoking.,    Pulmonary exam normal        Cardiovascular negative cardio ROS Normal cardiovascular exam     Neuro/Psych PSYCHIATRIC DISORDERS Depression negative neurological ROS     GI/Hepatic Neg liver ROS, GERD  ,  Endo/Other  negative endocrine ROS  Renal/GU negative Renal ROS     Musculoskeletal negative musculoskeletal ROS (+)   Abdominal   Peds  Hematology negative hematology ROS (+)   Anesthesia Other Findings Day of surgery medications reviewed with the patient.  Reproductive/Obstetrics                           Anesthesia Physical Anesthesia Plan  ASA: II  Anesthesia Plan: General   Post-op Pain Management:    Induction: Intravenous  PONV Risk Score and Plan: 3 and Ondansetron, Dexamethasone and Scopolamine patch - Pre-op  Airway Management Planned: Oral ETT  Additional Equipment:   Intra-op Plan:   Post-operative Plan: Extubation in OR  Informed Consent: I have reviewed the patients History and Physical, chart, labs and discussed the procedure including the risks, benefits and alternatives for the proposed anesthesia with the patient or authorized representative who has indicated his/her understanding and acceptance.     Dental advisory given  Plan Discussed with: Anesthesiologist and CRNA  Anesthesia Plan Comments:        Anesthesia Quick Evaluation

## 2018-09-30 NOTE — Progress Notes (Signed)
Prior to start of procedure, contrast dye allergy was noted in pt chart but didn't specify what kind of reaction would occur. Pt to receive contrast dye during procedure. Dr Rush Landmark ordered 50mg  Benadryl, 60mg  SoluCortef IV and plan to continue with procedure as scheduled.

## 2018-09-30 NOTE — Interval H&P Note (Signed)
History and Physical Interval Note:  09/30/2018 10:02 AM  Kayla Edwards  has presented today for surgery, with the diagnosis of Choledocholithiasis.  The various methods of treatment have been discussed with the patient and family. After consideration of risks, benefits and other options for treatment, the patient has consented to  Procedure(s): ENDOSCOPIC RETROGRADE CHOLANGIOPANCREATOGRAPHY (ERCP) (N/A) as a surgical intervention.  The patient's history has been reviewed, patient examined, no change in status, stable for surgery.  I have reviewed the patient's chart and labs.  Questions were answered to the patient's satisfaction.     [employer]

## 2018-09-30 NOTE — Progress Notes (Signed)
PROGRESS NOTE    Kayla Edwards  EFE:[id number] DOB: Jul 19, 1995 DOA: 09/29/2018 PCP: Patient, No Pcp Per    Brief Narrative:   Kayla Edwards is a 23 y.o. female with medical history significant for GERD and tobacco use who presented to the [facility] ED for evaluation of abdominal pain.  Patient was initially seen at [facility] ED on 09/27/2018 felt was severe attack of her acid reflux.  She had a RUQ abdominal ultrasound performed which showed multiple gallstones up to 1.4 cm without evidence of cholecystitis or biliary distention.  CBD diameter was 4.8 mm.  She returned on 09/29/2018 after she noticed yellowing of her eyes and orange discoloration of her urine.  She was not having much pain or nausea/vomiting.  [facility] course:  Vitals on discharge showed BP 128/85, pulse 84, RR 18, temp 97.3 Fahrenheit, SPO2 98% on room air. Labs were notable for AST 282, ALT 498, alk phos 188, total bilirubin 5.5, BUN 8, creatinine 0.83, WBC 5.0, hemoglobin 13.6, platelets 314,000, TSH 2.002 hemoglobin A1c 4.9%, acetaminophen level undetectable. Acute hepatitis panel was obtained and pending.  Urinalysis was negative for UTI and urine pregnancy test was negative. SARS-CoV-2 test was negative on 09/29/2018. CT abdomen/pelvis without contrast was obtained which did not show acute abnormality.  General surgery were consulted and were concerned about choledocholithiasis.  GI were consulted and recommended MRCP. MRCP was obtained and showed choledocholithiasis with mild biliary duct dilatation and cholelithiasis.  Mild hepatic steatosis also noted.  Due to no availability to perform ERCP at [facility], patient was transferred to [place]:   Active Problems:   Cholelithiasis with choledocholithiasis   Tobacco use  Choledocholithiasis and cholelithiasis: MRCP on 09/29/2018 notable for choledocholithiasis with biliary duct dilation and cholelithiasis.  Afebrile without leukocytosis. Patient currently  hemodynamically stable without suspicion for cholangitis or pancreatitis.  Pain is controlled. --GI and general surgery following, appreciate assistance --Plan ERCP today --Plan laparoscopic cholecystectomy on 10/01/2018 --Continue as needed pain control and antiemetics  Tobacco use: Smoking 1 PPD.  She is motivated to quit and feels that the nicotine patch is beneficial.   DVT prophylaxis: SCDs Code Status: Full code Family Communication: None Disposition Plan: Continue inpatient, pending ERCP followed by laparoscopic cholecystectomy   Consultants:   [facility]  General surgery  Procedures:   Pending ERCP 09/30/2018  Planned cholecystectomy 10/01/2018  Antimicrobials:   None   Subjective: Patient seen and examined at bedside, resting comfortably.  Pain improved.  Awaiting ERCP this afternoon.  No other complaints or concerns at this time.  Denies headache, no fever/chills/night sweats, no nausea vomit/diarrhea, no chest pain, no palpitations, no issues with bowel/bladder function, no paresthesia.  No acute events overnight per nursing staff.  Objective: Vitals:   09/30/18 0324 09/30/18 0757  BP: 126/74 (!) 121/56  Pulse: 89 71  Resp: 18   Temp: (!) 97.5 F (36.4 C) 98.4 F (36.9 C)  TempSrc: Oral Oral  SpO2: 95% 99%    Intake/Output Summary (Last 24 hours) at 09/30/2018 1359 Last data filed at 09/30/2018 0900 Gross per 24 hour  Intake 0 ml  Output --  Net 0 ml   There were no vitals filed for this visit.  Examination:  General exam: Appears calm and comfortable  Respiratory system: Clear to auscultation. Respiratory effort normal. Cardiovascular system: S1 & S2 heard, RRR. No JVD, murmurs, rubs, gallops or clicks. No pedal edema. Gastrointestinal system: Abdomen is nondistended, mild right upper quadrant tenderness, soft. No organomegaly  or masses felt. Normal bowel sounds heard. Central nervous system: Alert and oriented. No focal neurological  deficits. Extremities: Symmetric 5 x 5 power. Skin: No rashes, lesions or ulcers Psychiatry: Judgement and insight appear normal. Mood & affect appropriate.     Data Reviewed: I have personally reviewed following labs and imaging studies  CBC: Recent Labs  Lab 09/27/18 0231 09/29/18 0011 09/30/18 0249  WBC 10.3 5.0 4.9  NEUTROABS 7.2  --   --   HGB 13.3 13.6 12.4  HCT 39.6 40.7 37.4  MCV 86.5 86.2 88.8  PLT 359 314 749   Basic Metabolic Panel: Recent Labs  Lab 09/27/18 0231 09/29/18 0011 09/30/18 0249  NA 140 137 138  K 3.8 3.5 3.9  CL 107 104 108  CO2 26 23 19*  GLUCOSE 103* 128* 81  BUN _0 CREATININE 0.71 0.83 0.73  CALCIUM 9.5 9.8 9.0   GFR: Estimated Creatinine Clearance: 111.9 mL/min (by C-G formula based on SCr of 0.73 mg/dL). Liver Function Tests: Recent Labs  Lab 09/27/18 0231 09/29/18 0011 09/30/18 0249  AST 125* 282* 167*  ALT 91* 498* 337*  ALKPHOS 104 188* 150*  BILITOT 0.5 5.5* 2.8*  PROT 7.5 8.0 6.3*  ALBUMIN 4.3 4.5 3.5   Recent Labs  Lab 09/27/18 0231  LIPASE 54*   No results for input(s): AMMONIA in the last 168 hours. Coagulation Profile: No results for input(s): INR, PROTIME in the last 168 hours. Cardiac Enzymes: No results for input(s): CKTOTAL, CKMB, CKMBINDEX, TROPONINI in the last 168 hours. BNP (last 3 results) No results for input(s): PROBNP in the last 8760 hours. HbA1C: Recent Labs    09/29/18 0701  HGBA1C 4.9   CBG: No results for input(s): GLUCAP in the last 168 hours. Lipid Profile: No results for input(s): CHOL, HDL, LDLCALC, TRIG, CHOLHDL, LDLDIRECT in the last 72 hours. Thyroid Function Tests: Recent Labs    09/29/18 0701  TSH 2.002   Anemia Panel: No results for input(s): VITAMINB12, FOLATE, FERRITIN, TIBC, IRON, RETICCTPCT in the last 72 hours. Sepsis Labs: No results for input(s): PROCALCITON, LATICACIDVEN in the last 168 hours.  Recent Results (from the past 240 hour(s))  SARS Coronavirus 2  [facility] order, Performed in [facility] hospital lab) Nasopharyngeal Nasopharyngeal Swab     Status: None   Collection Time: 09/29/18  1:34 AM   Specimen: Nasopharyngeal Swab  Result Value Ref Range Status   SARS Coronavirus 2 NEGATIVE NEGATIVE Final    Comment: (NOTE) If result is NEGATIVE SARS-CoV-2 target nucleic acids are NOT DETECTED. The SARS-CoV-2 RNA is generally detectable in upper and lower  respiratory specimens during the acute phase of infection. The lowest  concentration of SARS-CoV-2 viral copies this assay can detect is 250  copies / mL. A negative result does not preclude SARS-CoV-2 infection  and should not be used as the sole basis for treatment or other  patient management decisions.  A negative result may occur with  improper specimen collection / handling, submission of specimen other  than nasopharyngeal swab, presence of viral mutation(s) within the  areas targeted by this assay, and inadequate number of viral copies  (<250 copies / mL). A negative result must be combined with clinical  observations, patient history, and epidemiological information. If result is POSITIVE SARS-CoV-2 target nucleic acids are DETECTED. The SARS-CoV-2 RNA is generally detectable in upper and lower  respiratory specimens dur ing the acute phase of infection.  Positive  results are indicative of active  infection with SARS-CoV-2.  Clinical  correlation with patient history and other diagnostic information is  necessary to determine patient infection status.  Positive results do  not rule out bacterial infection or co-infection with other viruses. If result is PRESUMPTIVE POSTIVE SARS-CoV-2 nucleic acids MAY BE PRESENT.   A presumptive positive result was obtained on the submitted specimen  and confirmed on repeat testing.  While 2019 novel coronavirus  (SARS-CoV-2) nucleic acids may be present in the submitted sample  additional confirmatory testing may be necessary for  epidemiological  and / or clinical management purposes  to differentiate between  SARS-CoV-2 and other Sarbecovirus currently known to infect humans.  If clinically indicated additional testing with an alternate test  methodology [phone]) is advised. The SARS-CoV-2 RNA is generally  detectable in upper and lower respiratory sp ecimens during the acute  phase of infection. The expected result is Negative. Fact Sheet for Patients:  StrictlyIdeas.no Fact Sheet for Healthcare Providers: BankingDealers.co.za This test is not yet approved or cleared by the [place] FDA and has been authorized for detection and/or diagnosis of SARS-CoV-2 by FDA under an Emergency Use Authorization (EUA).  This EUA will remain in effect (meaning this test can be used) for the duration of the COVID-19 declaration under Section 564(b)(1) of the Act, 21 U.S.C. section 360bbb-3(b)(1), unless the authorization is terminated or revoked sooner. Performed at [facility], [address] [place], [place] [address]          Radiology Studies: Ct Abdomen Pelvis Wo Contrast  Result Date: 09/29/2018 CLINICAL DATA:  Epigastric pain and known cholelithiasis with increasing jaundice EXAM: CT ABDOMEN AND PELVIS WITHOUT CONTRAST TECHNIQUE: Multidetector CT imaging of the abdomen and pelvis was performed following the standard protocol without IV contrast. COMPARISON:  Ultrasound from 09/27/2018 FINDINGS: Lower chest: No acute abnormality. Hepatobiliary: Liver is well visualized without significant mass lesion. The gallbladder is well distended. The known gallstones are not well appreciated on this exam. No biliary ductal dilatation is seen. Pancreas: Unremarkable. No pancreatic ductal dilatation or surrounding inflammatory changes. Spleen: Normal in size without focal abnormality. Adrenals/Urinary Tract: Adrenal glands are within normal limits. Kidneys are well  visualized bilaterally. No renal calculi or obstructive changes are noted. Bladder is within normal limits. Stomach/Bowel: Stomach is within normal limits. Appendix appears normal. No evidence of bowel wall thickening, distention, or inflammatory changes. Vascular/Lymphatic: No significant vascular findings are present. No enlarged abdominal or pelvic lymph nodes. Reproductive: Uterus and bilateral adnexa are unremarkable. Other: No abdominal wall hernia or abnormality. No abdominopelvic ascites. Musculoskeletal: No acute or significant osseous findings. IMPRESSION: No acute abnormality noted. Known cholelithiasis is not as well appreciated on CT examination. The significant increase in liver function tests is likely related to hepatocellular dysfunction as no significant obstructive changes seen. Electronically Signed   By: Inez Catalina M.D.   On: 09/29/2018 03:13   Mr Abdomen Mrcp Moise Boring Contast  Result Date: 09/29/2018 CLINICAL DATA:  Jaundice.  Known gallstones. EXAM: MRI ABDOMEN WITHOUT AND WITH CONTRAST (INCLUDING MRCP) TECHNIQUE: Multiplanar multisequence MR imaging of the abdomen was performed both before and after the administration of intravenous contrast. Heavily T2-weighted images of the biliary and pancreatic ducts were obtained, and three-dimensional MRCP images were rendered by post processing. CONTRAST:  7 cc Gadavist COMPARISON:  CT of earlier in the day.  Ultrasound of 09/27/2018. FINDINGS: Lower chest: Normal heart size without pericardial or pleural effusion. Hepatobiliary: Mild hepatic steatosis. No focal liver lesion. Multiple gallstones including at  up to 1.0 cm. No evidence of acute cholecystitis. Mild intrahepatic biliary duct dilatation. The common duct is mildly dilated, including at maximally 9 mm in the porta hepatis on 42/11. Two tiny (1-2 mm) distal common duct stones are identified including on 37/11 and 9/13. Pancreas:  Normal, without mass or ductal dilatation. Spleen:  Normal in  size, without focal abnormality. Adrenals/Urinary Tract: Normal adrenal glands. Normal kidneys, without hydronephrosis. Stomach/Bowel: Normal stomach and abdominal bowel loops. Vascular/Lymphatic: Normal caliber of the aorta and branch vessels. No abdominal adenopathy. Other:  No ascites. Musculoskeletal: No acute osseous abnormality. IMPRESSION: 1. Choledocholithiasis with mild biliary duct dilatation. 2. Cholelithiasis. 3. Mild hepatic steatosis. Electronically Signed   By: Abigail Miyamoto M.D.   On: 09/29/2018 15:20        Scheduled Meds:  nicotine  21 mg Transdermal Daily   Continuous Infusions:   LOS: 1 day    Time spent: 35 minutes spent on chart review, discussion with nursing staff, consultants, updating family and interview/physical exam; more than 50% of that time was spent in counseling and/or coordination of care.    Bryn Perkin J [place] DO [facility] Pager [phone]  If 7PM-7AM, please contact night-coverage [email or web address] Password [email or web address] 09/30/2018, 1:59 PM

## 2018-09-30 NOTE — Consult Note (Signed)
° °                                                                          [facility] Consult: °9:38 AM °09/30/2018 ° LOS: 1 day  ° ° °Referring Provider: Dr Austria  °Primary Care Physician:  Patient, No Pcp Per °Primary Gastroenterologist:  unassigned ° ° ° °Reason for Consultation:  Choledocholithiasis.   °  °HPI: Kayla Edwards is a 23 y.o. female.  Hx depression.  GERD.  Pilonidal cyst removed in early July 2020. ° °About a year ago she was prescribed famotidine for what was diagnosed as GERD.   Symptoms at that time similar to what she is having now but not as severe.  Epigastric/RUQ burning pain.  It did not last more than a few hours.  Periodically has been using Pepcid for brief recurrences. °8/4 ED visit to [facility] with sustained RUQ pain radiating into her back, some nausea.  Pt thought it was GERD or perhaps food poisoning but labs revealed elevated transaminases.   °8/4 ultrasound: gallstones, up to size 1.4 cm.  CBD 4.8 mm.  LFTs with ss.  She was set up to see a surgeon today regarding cholecystectomy. ° °Returned to [facility] 8/6 with icterus and darkening urine that started the night before.   The pain had not returned.  She was not nauseated.  No fever or chills. °09/29/18 CTAP: known GB stones not well seen.  No acute, chronic changes in liver, biliary system.  °8/6 MRCP: choledocholithiasis, cholelithiasis, 9 mm max CBD, mild fatty liver.    ° °Labs 8/4 >> 8/7:  °Lipase 54 on 8/4, not repeated. °T bili 0.5 >> 5.5 >>  2.8 °Alk phos 104 >> 188 >>150.  °AST/ALT 125/91 >> 282/498 >>167/337.   °WBCs, Hgb, platelets, APAP level. Acute Hep serologies all negative ° °Patient does not drink much alcohol.  She does drink a fair amount of Dr. Pepper.  She smokes.  Patient married, no children. °Family history notable for gallbladder disease and cholecystectomy in both maternal grandparents and maternal great  aunt.  No family history of gastric or colorectal cancers. ° °Past Medical History:  °Diagnosis Date  °• Depression   ° @ 12yrs of age was taking prozac- no insurance quit taking  °• GERD (gastroesophageal reflux disease)   ° ° °Past Surgical History:  °Procedure Laterality Date  °• PILONIDAL CYST EXCISION  08/2018  °• SUPERFICIAL LYMPH NODE BIOPSY / EXCISION  Around 23 years old  ° Benign right axillary "infected" node excision  ° ° °Prior to Admission medications   °Medication Sig Start Date End Date Taking? Authorizing Provider  °nicotine (NICODERM CQ - DOSED IN MG/24 HOURS) 21 mg/24hr patch Place 1 patch (21 mg total) onto the skin daily. 09/30/18   Mody, Sital, MD  ° ° °Scheduled Meds: °• nicotine  21 mg Transdermal Daily  ° °Infusions: °• sodium chloride 125 mL/hr at 09/30/18 0611  ° °PRN Meds: °morphine injection, ondansetron **OR** ondansetron (ZOFRAN) IV, oxyCODONE ° ° °Allergies as of 09/29/2018 - Review Complete 09/29/2018  °Allergen Reaction Noted  °• Contrast media [iodinated diagnostic agents]  09/28/2018  ° ° °Family History  °Problem Relation Age of Onset  °• Diabetes Maternal   Grandmother   ° ° °Social History  ° °Socioeconomic History  °• Marital status: Married  °  Spouse name: Not on file  °• Number of children: Not on file  °• Years of education: Not on file  °• Highest education level: Not on file  °Occupational History  °• Not on file  °Social Needs  °• Financial resource strain: Not on file  °• Food insecurity  °  Worry: Not on file  °  Inability: Not on file  °• Transportation needs  °  Medical: Not on file  °  Non-medical: Not on file  °Tobacco Use  °• Smoking status: Current Every Day Smoker  °  Packs/day: 1.00  °  Types: Cigarettes  °• Smokeless tobacco: Never Used  °Substance and Sexual Activity  °• Alcohol use: Not Currently  °• Drug use: Not Currently  °• Sexual activity: Yes  °Lifestyle  °• Physical activity  °  Days per week: Not on file  °  Minutes per session: Not on file  °• Stress:  Not on file  °Relationships  °• Social connections  °  Talks on phone: Not on file  °  Gets together: Not on file  °  Attends religious service: Not on file  °  Active member of club or organization: Not on file  °  Attends meetings of clubs or organizations: Not on file  °  Relationship status: Not on file  °• Intimate partner violence  °  Fear of current or ex partner: Not on file  °  Emotionally abused: Not on file  °  Physically abused: Not on file  °  Forced sexual activity: Not on file  °Other Topics Concern  °• Not on file  °Social History Narrative  °• Not on file  ° ° °REVIEW OF SYSTEMS: °Constitutional: No weakness or fatigue. °ENT:  No nose bleeds °Pulm: No shortness of breath, no cough. °CV:  No palpitations, no LE edema.  No angina. °GU: Dark urine a couple of days ago is becoming less dark this morning.  No hematuria, no frequency °GI: See HPI °Heme: Unusual bleeding or bruising. °Transfusions: None. °Neuro:  No headaches, no peripheral tingling or numbness.   Syncope, no seizures. °Derm:  No itching, no rash or sores.  °Endocrine:  No sweats or chills.  No polyuria or dysuria °Immunization: Not queried. °Travel:  None beyond local counties in last few months.  ° ° °PHYSICAL EXAM: °Vital signs in last 24 hours: °Vitals:  ° 09/30/18 0324 09/30/18 0757  °BP: 126/74 (!) 121/56  °Pulse: 89 71  °Resp: 18   °Temp: (!) 97.5 °F (36.4 °C) 98.4 °F (36.9 °C)  °SpO2: 95% 99%  ° °Wt Readings from Last 3 Encounters:  °09/28/18 72.6 kg  °02/25/18 61.2 kg  °12/22/17 61.2 kg (62 %, Z= 0.30)*  ° °* Growth percentiles are based on CDC (Girls, 2-20 Years) data.  ° ° °General: Pleasant, cooperative, comfortable, non-ill-appearing °Head: No facial asymmetry or swelling.  No signs of head trauma. °Eyes: Mild scleral icterus.  No conjunctival pallor. °Ears: Not HOH. °Nose: Piercings, no discharge. °Mouth: Oropharynx moist, pink, clear.  Good dentition.  Tongue midline. °Neck: No mass, no thyromegaly, no JVD. °Lungs: Clear  bilaterally.  No cough or labored breathing °Heart: RRR. °Abdomen: Soft.  Not tender.  No distention.  Bowel sounds present, hypoactive.     °Rectal: Deferred °Musc/Skeltl: No joint redness, swelling or gross deformity. °Extremities: CCE. °Neurologic: Oriented x3.  Fully alert.    Good historian.  No tremors or limb weakness. °Skin: No obvious jaundice. °Tattoos: On the trunk, professional quality. °Nodes: No cervical or inguinal adenopathy. °Psych: Operative, calm, pleasant. ° °Intake/Output from previous day: °No intake/output data recorded. °Intake/Output this shift: °No intake/output data recorded. ° °LAB RESULTS: °Recent Labs  °  09/29/18 °0011 09/30/18 °0249  °WBC 5.0 4.9  °HGB 13.6 12.4  °HCT 40.7 37.4  °PLT 314 289  ° °BMET °Lab Results  °Component Value Date  ° NA 138 09/30/2018  ° NA 137 09/29/2018  ° NA 140 09/27/2018  ° K 3.9 09/30/2018  ° K 3.5 09/29/2018  ° K 3.8 09/27/2018  ° CL 108 09/30/2018  ° CL 104 09/29/2018  ° CL 107 09/27/2018  ° CO2 19 (L) 09/30/2018  ° CO2 23 09/29/2018  ° CO2 26 09/27/2018  ° GLUCOSE 81 09/30/2018  ° GLUCOSE 128 (H) 09/29/2018  ° GLUCOSE 103 (H) 09/27/2018  ° BUN 6 09/30/2018  ° BUN 8 09/29/2018  ° BUN 7 09/27/2018  ° CREATININE 0.73 09/30/2018  ° CREATININE 0.83 09/29/2018  ° CREATININE 0.71 09/27/2018  ° CALCIUM 9.0 09/30/2018  ° CALCIUM 9.8 09/29/2018  ° CALCIUM 9.5 09/27/2018  ° °LFT °Recent Labs  °  09/29/18 °0011 09/30/18 °0249  °PROT 8.0 6.3*  °ALBUMIN 4.5 3.5  °AST 282* 167*  °ALT 498* 337*  °ALKPHOS 188* 150*  °BILITOT 5.5* 2.8*  ° °PT/INR °No results found for: INR, PROTIME °Hepatitis Panel °Recent Labs  °  09/29/18 °0701  °HEPBSAG Negative  °HCVAB 0.1  °HEPAIGM Negative  °HEPBIGM Negative  ° °C-Diff °No components found for: CDIFF °Lipase  °   °Component Value Date/Time  ° LIPASE 54 (H) 09/27/2018 0231  ° ° ° ° °RADIOLOGY STUDIES: °Ct Abdomen Pelvis Wo Contrast ° °Result Date: 09/29/2018 °CLINICAL DATA:  Epigastric pain and known cholelithiasis with increasing  jaundice EXAM: CT ABDOMEN AND PELVIS WITHOUT CONTRAST TECHNIQUE: Multidetector CT imaging of the abdomen and pelvis was performed following the standard protocol without IV contrast. COMPARISON:  Ultrasound from 09/27/2018 FINDINGS: Lower chest: No acute abnormality. Hepatobiliary: Liver is well visualized without significant mass lesion. The gallbladder is well distended. The known gallstones are not well appreciated on this exam. No biliary ductal dilatation is seen. Pancreas: Unremarkable. No pancreatic ductal dilatation or surrounding inflammatory changes. Spleen: Normal in size without focal abnormality. Adrenals/Urinary Tract: Adrenal glands are within normal limits. Kidneys are well visualized bilaterally. No renal calculi or obstructive changes are noted. Bladder is within normal limits. Stomach/Bowel: Stomach is within normal limits. Appendix appears normal. No evidence of bowel wall thickening, distention, or inflammatory changes. Vascular/Lymphatic: No significant vascular findings are present. No enlarged abdominal or pelvic lymph nodes. Reproductive: Uterus and bilateral adnexa are unremarkable. Other: No abdominal wall hernia or abnormality. No abdominopelvic ascites. Musculoskeletal: No acute or significant osseous findings. IMPRESSION: No acute abnormality noted. Known cholelithiasis is not as well appreciated on CT examination. The significant increase in liver function tests is likely related to hepatocellular dysfunction as no significant obstructive changes seen. Electronically Signed   By: Mark  Lukens M.D.   On: 09/29/2018 03:13  ° °Mr Abdomen Mrcp W Wo Contast ° °Result Date: 09/29/2018 °CLINICAL DATA:  Jaundice.  Known gallstones. EXAM: MRI ABDOMEN WITHOUT AND WITH CONTRAST (INCLUDING MRCP) TECHNIQUE: Multiplanar multisequence MR imaging of the abdomen was performed both before and after the administration of intravenous contrast. Heavily T2-weighted images of the biliary and pancreatic ducts  were obtained, and three-dimensional MRCP images were rendered   by post processing. CONTRAST:  7 cc Gadavist COMPARISON:  CT of earlier in the day.  Ultrasound of 09/27/2018. FINDINGS: Lower chest: Normal heart size without pericardial or pleural effusion. Hepatobiliary: Mild hepatic steatosis. No focal liver lesion. Multiple gallstones including at up to 1.0 cm. No evidence of acute cholecystitis. Mild intrahepatic biliary duct dilatation. The common duct is mildly dilated, including at maximally 9 mm in the porta hepatis on 42/11. Two tiny (1-2 mm) distal common duct stones are identified including on 37/11 and 9/13. Pancreas:  Normal, without mass or ductal dilatation. Spleen:  Normal in size, without focal abnormality. Adrenals/Urinary Tract: Normal adrenal glands. Normal kidneys, without hydronephrosis. Stomach/Bowel: Normal stomach and abdominal bowel loops. Vascular/Lymphatic: Normal caliber of the aorta and branch vessels. No abdominal adenopathy. Other:  No ascites. Musculoskeletal: No acute osseous abnormality. IMPRESSION: 1. Choledocholithiasis with mild biliary duct dilatation. 2. Cholelithiasis. 3. Mild hepatic steatosis. Electronically Signed   By: Kyle  Talbot M.D.   On: 09/29/2018 15:20  ° ° ° °IMPRESSION:  ° °*   Gallstones, choledocholithiasis.  Symptomatic with jaundice, abd pain.   ° °*   Minor elevation of lipase 3 days ago, no evidence for pancreatitis or pancreatic ductal disruption on multiple imaging modalities. ° °*    Fatty liver.   ° ° ° °PLAN:  °  ° °*   ERCP planned for this afternoon..   ° °*     If general surgery has not been consulted, please do so as patient will require laparoscopic cholecystectomy. ° ° °Marchetta Navratil  09/30/2018, 9:38 AM °Phone [phone] ° ° °  °

## 2018-09-30 NOTE — H&P (View-Only) (Signed)
[facility] Consult: 9:38 AM 09/30/2018  LOS: 1 day    Referring Provider: Dr [place]  Primary Care Physician:  Patient, No Pcp Per Primary Gastroenterologist:  unassigned    Reason for Consultation:  Choledocholithiasis.     HPI: Kayla Edwards is a 23 y.o. female.  Hx depression.  GERD.  Pilonidal cyst removed in early July 2020.  About a year ago she was prescribed famotidine for what was diagnosed as GERD.   Symptoms at that time similar to what she is having now but not as severe.  Epigastric/RUQ burning pain.  It did not last more than a few hours.  Periodically has been using Pepcid for brief recurrences. 8/4 ED visit to [facility] with sustained RUQ pain radiating into her back, some nausea.  Pt thought it was GERD or perhaps food poisoning but labs revealed elevated transaminases.   8/4 ultrasound: gallstones, up to size 1.4 cm.  CBD 4.8 mm.  LFTs with ss.  She was set up to see a surgeon today regarding cholecystectomy.  Returned to [facility] 8/6 with icterus and darkening urine that started the night before.   The pain had not returned.  She was not nauseated.  No fever or chills. 09/29/18 CTAP: known GB stones not well seen.  No acute, chronic changes in liver, biliary system.  8/6 MRCP: choledocholithiasis, cholelithiasis, 9 mm max CBD, mild fatty liver.     Labs 8/4 >> 8/7:  Lipase 54 on 8/4, not repeated. T bili 0.5 >> 5.5 >>  2.8 Alk phos 104 >> 188 >>150.  AST/ALT 125/91 >> 282/498 >>167/337.   WBCs, Hgb, platelets, APAP level. Acute Hep serologies all negative  Patient does not drink much alcohol.  She does drink a fair amount of Dr. Malachi Bonds.  She smokes.  Patient married, no children. Family history notable for gallbladder disease and cholecystectomy in both maternal grandparents and maternal great  aunt.  No family history of gastric or colorectal cancers.  Past Medical History:  Diagnosis Date   Depression    @ 15yr of age was taking prozac- no insurance quit taking   GERD (gastroesophageal reflux disease)     Past Surgical History:  Procedure Laterality Date   PILONIDAL CYST EXCISION  08/2018   SUPERFICIAL LYMPH NODE BIOPSY / EXCISION  Around 23years old   Benign right axillary "infected" node excision    Prior to Admission medications   Medication Sig Start Date End Date Taking? Authorizing Provider  nicotine (NICODERM CQ - DOSED IN MG/24 HOURS) 21 mg/24hr patch Place 1 patch (21 mg total) onto the skin daily. 09/30/18   MBettey Costa MD    Scheduled Meds:  nicotine  21 mg Transdermal Daily   Infusions:  sodium chloride 125 mL/hr at 09/30/18 0611   PRN Meds: morphine injection, ondansetron **OR** ondansetron (ZOFRAN) IV, oxyCODONE   Allergies as of 09/29/2018 - Review Complete 09/29/2018  Allergen Reaction Noted   Contrast media [iodinated diagnostic agents]  09/28/2018    Family History  Problem Relation Age of Onset   Diabetes Maternal  Grandmother     Social History   Socioeconomic History   Marital status: Married    Spouse name: Not on file   Number of children: Not on file   Years of education: Not on file   Highest education level: Not on file  Occupational History   Not on file  Social Needs   Financial resource strain: Not on file   Food insecurity    Worry: Not on file    Inability: Not on file   Transportation needs    Medical: Not on file    Non-medical: Not on file  Tobacco Use   Smoking status: Current Every Day Smoker    Packs/day: 1.00    Types: Cigarettes   Smokeless tobacco: Never Used  Substance and Sexual Activity   Alcohol use: Not Currently   Drug use: Not Currently   Sexual activity: Yes  Lifestyle   Physical activity    Days per week: Not on file    Minutes per session: Not on file   Stress:  Not on file  Relationships   Social connections    Talks on phone: Not on file    Gets together: Not on file    Attends religious service: Not on file    Active member of club or organization: Not on file    Attends meetings of clubs or organizations: Not on file    Relationship status: Not on file   Intimate partner violence    Fear of current or ex partner: Not on file    Emotionally abused: Not on file    Physically abused: Not on file    Forced sexual activity: Not on file  Other Topics Concern   Not on file  Social History Narrative   Not on file    REVIEW OF SYSTEMS: Constitutional: No weakness or fatigue. ENT:  No nose bleeds Pulm: No shortness of breath, no cough. CV:  No palpitations, no LE edema.  No angina. GU: Dark urine a couple of days ago is becoming less dark this morning.  No hematuria, no frequency GI: See HPI Heme: Unusual bleeding or bruising. Transfusions: None. Neuro:  No headaches, no peripheral tingling or numbness.   Syncope, no seizures. Derm:  No itching, no rash or sores.  Endocrine:  No sweats or chills.  No polyuria or dysuria Immunization: Not queried. Travel:  None beyond local counties in last few months.    PHYSICAL EXAM: Vital signs in last 24 hours: Vitals:   09/30/18 0324 09/30/18 0757  BP: 126/74 (!) 121/56  Pulse: 89 71  Resp: 18   Temp: (!) 97.5 F (36.4 C) 98.4 F (36.9 C)  SpO2: 95% 99%   Wt Readings from Last 3 Encounters:  09/28/18 72.6 kg  02/25/18 61.2 kg  12/22/17 61.2 kg (62 %, Z= 0.30)*   * Growth percentiles are based on CDC (Girls, 2-20 Years) data.    General: Pleasant, cooperative, comfortable, non-ill-appearing Head: No facial asymmetry or swelling.  No signs of head trauma. Eyes: Mild scleral icterus.  No conjunctival pallor. Ears: Not HOH. Nose: Piercings, no discharge. Mouth: Oropharynx moist, pink, clear.  Good dentition.  Tongue midline. Neck: No mass, no thyromegaly, no JVD. Lungs: Clear  bilaterally.  No cough or labored breathing Heart: RRR. Abdomen: Soft.  Not tender.  No distention.  Bowel sounds present, hypoactive.     Rectal: Deferred Musc/Skeltl: No joint redness, swelling or gross deformity. Extremities: CCE. Neurologic: Oriented x3.  Fully alert.  Good historian.  No tremors or limb weakness. Skin: No obvious jaundice. Tattoos: On the trunk, professional quality. Nodes: No cervical or inguinal adenopathy. Psych: Operative, calm, pleasant.  Intake/Output from previous day: No intake/output data recorded. Intake/Output this shift: No intake/output data recorded.  LAB RESULTS: Recent Labs    09/29/18 0011 09/30/18 0249  WBC 5.0 4.9  HGB 13.6 12.4  HCT 40.7 37.4  PLT 314 289   BMET Lab Results  Component Value Date   NA 138 09/30/2018   NA 137 09/29/2018   NA 140 09/27/2018   K 3.9 09/30/2018   K 3.5 09/29/2018   K 3.8 09/27/2018   CL 108 09/30/2018   CL 104 09/29/2018   CL 107 09/27/2018   CO2 19 (L) 09/30/2018   CO2 23 09/29/2018   CO2 26 09/27/2018   GLUCOSE 81 09/30/2018   GLUCOSE 128 (H) 09/29/2018   GLUCOSE 103 (H) 09/27/2018   BUN 6 09/30/2018   BUN 8 09/29/2018   BUN 7 09/27/2018   CREATININE 0.73 09/30/2018   CREATININE 0.83 09/29/2018   CREATININE 0.71 09/27/2018   CALCIUM 9.0 09/30/2018   CALCIUM 9.8 09/29/2018   CALCIUM 9.5 09/27/2018   LFT Recent Labs    09/29/18 0011 09/30/18 0249  PROT 8.0 6.3*  ALBUMIN 4.5 3.5  AST 282* 167*  ALT 498* 337*  ALKPHOS 188* 150*  BILITOT 5.5* 2.8*   PT/INR No results found for: INR, PROTIME Hepatitis Panel Recent Labs    09/29/18 0701  HEPBSAG Negative  HCVAB 0.1  HEPAIGM Negative  HEPBIGM Negative   C-Diff No components found for: CDIFF Lipase     Component Value Date/Time   LIPASE 54 (H) 09/27/2018 0231      RADIOLOGY STUDIES: Ct Abdomen Pelvis Wo Contrast  Result Date: 09/29/2018 CLINICAL DATA:  Epigastric pain and known cholelithiasis with increasing  jaundice EXAM: CT ABDOMEN AND PELVIS WITHOUT CONTRAST TECHNIQUE: Multidetector CT imaging of the abdomen and pelvis was performed following the standard protocol without IV contrast. COMPARISON:  Ultrasound from 09/27/2018 FINDINGS: Lower chest: No acute abnormality. Hepatobiliary: Liver is well visualized without significant mass lesion. The gallbladder is well distended. The known gallstones are not well appreciated on this exam. No biliary ductal dilatation is seen. Pancreas: Unremarkable. No pancreatic ductal dilatation or surrounding inflammatory changes. Spleen: Normal in size without focal abnormality. Adrenals/Urinary Tract: Adrenal glands are within normal limits. Kidneys are well visualized bilaterally. No renal calculi or obstructive changes are noted. Bladder is within normal limits. Stomach/Bowel: Stomach is within normal limits. Appendix appears normal. No evidence of bowel wall thickening, distention, or inflammatory changes. Vascular/Lymphatic: No significant vascular findings are present. No enlarged abdominal or pelvic lymph nodes. Reproductive: Uterus and bilateral adnexa are unremarkable. Other: No abdominal wall hernia or abnormality. No abdominopelvic ascites. Musculoskeletal: No acute or significant osseous findings. IMPRESSION: No acute abnormality noted. Known cholelithiasis is not as well appreciated on CT examination. The significant increase in liver function tests is likely related to hepatocellular dysfunction as no significant obstructive changes seen. Electronically Signed   By: Inez Catalina M.D.   On: 09/29/2018 03:13   Mr Abdomen Mrcp Moise Boring Contast  Result Date: 09/29/2018 CLINICAL DATA:  Jaundice.  Known gallstones. EXAM: MRI ABDOMEN WITHOUT AND WITH CONTRAST (INCLUDING MRCP) TECHNIQUE: Multiplanar multisequence MR imaging of the abdomen was performed both before and after the administration of intravenous contrast. Heavily T2-weighted images of the biliary and pancreatic ducts  were obtained, and three-dimensional MRCP images were rendered  by post processing. CONTRAST:  7 cc Gadavist COMPARISON:  CT of earlier in the day.  Ultrasound of 09/27/2018. FINDINGS: Lower chest: Normal heart size without pericardial or pleural effusion. Hepatobiliary: Mild hepatic steatosis. No focal liver lesion. Multiple gallstones including at up to 1.0 cm. No evidence of acute cholecystitis. Mild intrahepatic biliary duct dilatation. The common duct is mildly dilated, including at maximally 9 mm in the porta hepatis on 42/11. Two tiny (1-2 mm) distal common duct stones are identified including on 37/11 and 9/13. Pancreas:  Normal, without mass or ductal dilatation. Spleen:  Normal in size, without focal abnormality. Adrenals/Urinary Tract: Normal adrenal glands. Normal kidneys, without hydronephrosis. Stomach/Bowel: Normal stomach and abdominal bowel loops. Vascular/Lymphatic: Normal caliber of the aorta and branch vessels. No abdominal adenopathy. Other:  No ascites. Musculoskeletal: No acute osseous abnormality. IMPRESSION: 1. Choledocholithiasis with mild biliary duct dilatation. 2. Cholelithiasis. 3. Mild hepatic steatosis. Electronically Signed   By: Abigail Miyamoto M.D.   On: 09/29/2018 15:20     IMPRESSION:   *   Gallstones, choledocholithiasis.  Symptomatic with jaundice, abd pain.    *   Minor elevation of lipase 3 days ago, no evidence for pancreatitis or pancreatic ductal disruption on multiple imaging modalities.  *    Fatty liver.      PLAN:     *   ERCP planned for this afternoon..    *     If general surgery has not been consulted, please do so as patient will require laparoscopic cholecystectomy.   Azucena Freed  09/30/2018, 9:38 AM Phone [phone]

## 2018-09-30 NOTE — Progress Notes (Signed)
After further review of the patient's chart at time of preprocedural timeout before beginning ERCP, we reviewed her allergies and she has a contrast iodine allergy. I spoke with the patient's mother and grandmother to discuss what her symptoms were as it is not in the system clearly delineated. At [facility] when she was much younger she had contrast and reportedly had some sensation of difficulty breathing while she was at an outpatient imaging procedure and received Benadryl and had no ill effects requiring any further intubation or other issues. She has received contrast via MRI/MRCP as early as yesterday. I was willing to move forward with an expedited Solu-Medrol dosing as well as Benadryl prior to moving forward with procedure if both the mother and grandmother were excepting of the patient's risks since she has not received a full steroid preparation. They understand that she could have a reaction post procedure, although unlikely. I will plan to proceed with asking our anesthesia colleagues if they are okay with moving forward and if they are we will plan to give Solu-Medrol and Benadryl.  I would also plan to give Solu-Medrol and Benadryl after the procedure in effort of trying to decrease her risk of having an allergy post procedure. The patient's mother and grandmother understand the risks that she could require intubation post procedurally if she had symptoms of airway concern but at this point in time they are okay with moving forward with procedure. I tried to call the patient's wife as well but was not able to reach her. At this point in time we will move forward with the procedure and minimize contrast injection. The patient's mother and grandmother are in agreement.   Justice Britain, MD [facility] Office # [phone]

## 2018-09-30 NOTE — Progress Notes (Signed)
The patient was received from Endo Department.  VS WNL.

## 2018-09-30 NOTE — Op Note (Signed)
[facility] Patient Name: Kayla Edwards Procedure Date : 09/30/2018 MRN: [id number] Attending MD: Justice Britain , MD Date of Birth: April 07, 1995 CSN: [id number] Age: 23 Admit Type: Inpatient Procedure:                ERCP Indications:              Bile duct stone(s), Abnormal MRCP, Evaluation and                            possible treatment of bile duct stone(s), Jaundice,                            Abnormal liver function test Providers:                Justice Britain, MD, Carlyn Reichert, RN, Elspeth Cho Tech., Technician, Carver Fila Referring MD:             [facility] Medicines:                General Anesthesia, Indomethacin 100 mg PR,                            Diphenhydramine 50 mg IV, Solumedrol 60 mg,                            Glucagon 2.84 mg IV Complications:            No immediate complications. Estimated Blood Loss:     Estimated blood loss was minimal. Procedure:                Pre-Anesthesia Assessment:                           - Prior to the procedure, a History and Physical                            was performed, and patient medications and                            allergies were reviewed. The patient's tolerance of                            previous anesthesia was also reviewed. The risks                            and benefits of the procedure and the sedation                            options and risks were discussed with the patient.                            All questions were answered, and informed consent  was obtained. Prior Anticoagulants: The patient has                            taken no previous anticoagulant or antiplatelet                            agents. ASA Grade Assessment: I - A normal, healthy                            patient. After reviewing the risks and benefits,                            the patient was deemed in satisfactory  condition to                            undergo the procedure.                           After obtaining informed consent, the scope was                            passed under direct vision. Throughout the                            procedure, the patient's blood pressure, pulse, and                            oxygen saturations were monitored continuously. The                            TJF-Q180V ([id number] Olympus duodenoscope was                            introduced through the mouth, and used to inject                            contrast into and used to inject contrast into the                            bile duct. The ERCP was accomplished without                            difficulty. The patient tolerated the procedure. Scope In: Scope Out: Findings:      The scout film was normal.      The upper GI tract was traversed under direct vision without detailed       examination. Multiple dispersed, small non-bleeding erosions were found       in the gastric body, at the incisura and in the gastric antrum. Biopsies       were taken in the gastric body, at the incisura and in the gastric       antrum through the ERCP scope with the cold forceps for histology and       HP. The major papilla was normal.      A short 0.025  inch Revolution Antonietta Breach was passed into the biliary tree.       The short-nosed traction sphincterotome was passed over the guidewire       and the bile duct was then deeply cannulated. Contrast was injected. I       personally interpreted the bile duct images. Ductal flow of contrast was       adequate. Image quality was adequate. Contrast extended to the entire       biliary tree. Opacification of the entire biliary tree was successful.       The maximum diameter of the ducts was 5 mm. The lower third of the main       duct contained filling defects thought to be stones. A 6 mm biliary       sphincterotomy was made with a monofilament Jagtome sphincterotome using        ERBE electrocautery. There was no post-sphincterotomy bleeding. To       discover objects, the biliary tree was swept with a retrieval balloon       starting at the bifurcation. Three stones fragments were removed. No       stones remained. An occlusion cholangiogram was performed that showed no       further significant biliary pathology. Total contrast injected was 15 mL.      A pancreatogram was not performed.      The duodenoscope was withdrawn from the patient. Impression:               - Non-bleeding erosive gastropathy. Biopsied for HP                            evaluation.                           - The major papilla appeared normal.                           - Filling defects consistent with stone fragments                            were seen on the cholangiogram.                           - Choledocholithiasis was found. Complete removal                            was accomplished by biliary sphincterotomy and                            balloon sweep. Recommendation:           - The patient will be observed post-procedure,                            until all discharge criteria are met.                           - Return patient to hospital ward for ongoing care.                           -  Check liver enzymes (AST, ALT, alkaline                            phosphatase, bilirubin) in the morning.                           - Observe patient's clinical course.                           - Watch for pancreatitis, bleeding, perforation,                            and cholangitis.                           - Will continue 125 cc/hr of LR in order to                            decrease risk of Post-ERCP Pancreatitis and also to                            help with clearance of the recently used biliary                            contrast.                           - Will give 2 more doses of steroids, IV Solumedrol                            60 mg Q6H to decrease risk of contrast  reaction.                           - Will give 2 more doses of PO Benadryl 25 mg to                            decrease risk of contrast reaction.                           - Will defer to surgical service if they feel that                            she needs further steroid preparation prior to                            cholecystectomy and possible cholangiogram.                           - Follow up pathology and treat HP if positive.                           - Start PPI 40 mg daily.                           -  The findings and recommendations were discussed                            with the patient.                           - The findings and recommendations were discussed                            with the patient's family.                           - The findings and recommendations were discussed                            with the referring physician. Procedure Code(s):        --- Professional ---                           [phone], Endoscopic retrograde                            cholangiopancreatography (ERCP); with removal of                            calculi/debris from biliary/pancreatic duct(s)                           43262, Endoscopic retrograde                            cholangiopancreatography (ERCP); with                            sphincterotomy/papillotomy                           43261, Endoscopic retrograde                            cholangiopancreatography (ERCP); with biopsy,                            single or multiple Diagnosis Code(s):        --- Professional ---                           K31.89, Other diseases of stomach and duodenum                           R93.2, Abnormal findings on diagnostic imaging of                            liver and biliary tract                           K80.50, Calculus of bile duct without cholangitis  or cholecystitis without obstruction                           R17, Unspecified jaundice                            R94.5, Abnormal results of liver function studies CPT copyright 2019 American Medical Association. All rights reserved. The codes documented in this report are preliminary and upon coder review may  be revised to meet current compliance requirements. Justice Britain, MD 09/30/2018 4:37:16 PM Number of Addenda: 0

## 2018-09-30 NOTE — Anesthesia Preprocedure Evaluation (Addendum)
Anesthesia Evaluation  Patient identified by MRN, date of birth, ID band Patient awake    Reviewed: Allergy & Precautions, NPO status , Patient's Chart, lab work & pertinent test results  Airway Mallampati: II  TM Distance: >3 FB Neck ROM: Full    Dental no notable dental hx. (+) Teeth Intact   Pulmonary Current Smoker and Patient abstained from smoking.,    Pulmonary exam normal breath sounds clear to auscultation       Cardiovascular negative cardio ROS Normal cardiovascular exam Rhythm:Regular Rate:Normal     Neuro/Psych PSYCHIATRIC DISORDERS Anxiety Depression negative neurological ROS     GI/Hepatic GERD  Medicated and Controlled,Elevated LFT's Choledocholithiasis   Endo/Other  negative endocrine ROS  Renal/GU negative Renal ROS  negative genitourinary   Musculoskeletal negative musculoskeletal ROS (+)   Abdominal   Peds negative pediatric ROS (+)  Hematology negative hematology ROS (+)   Anesthesia Other Findings   Reproductive/Obstetrics negative OB ROS                            Anesthesia Physical Anesthesia Plan  ASA: II  Anesthesia Plan: General   Post-op Pain Management:    Induction: Intravenous and Rapid sequence  PONV Risk Score and Plan: 2 and Ondansetron, Dexamethasone and Treatment may vary due to age or medical condition  Airway Management Planned: Oral ETT  Additional Equipment:   Intra-op Plan:   Post-operative Plan: Extubation in OR  Informed Consent: I have reviewed the patients History and Physical, chart, labs and discussed the procedure including the risks, benefits and alternatives for the proposed anesthesia with the patient or authorized representative who has indicated his/her understanding and acceptance.     Dental advisory given  Plan Discussed with: CRNA and Surgeon  Anesthesia Plan Comments:         Anesthesia Quick  Evaluation

## 2018-09-30 NOTE — Transfer of Care (Signed)
Immediate Anesthesia Transfer of Care Note  Patient: Kayla Edwards  Procedure(s) Performed: ENDOSCOPIC RETROGRADE CHOLANGIOPANCREATOGRAPHY (ERCP) (N/A ) SPHINCTEROTOMY REMOVAL OF STONES BIOPSY  Patient Location: PACU  Anesthesia Type:General  Level of Consciousness: awake, alert  and oriented  Airway & Oxygen Therapy: Patient Spontanous Breathing and Patient connected to face mask oxygen  Post-op Assessment: Report given to RN and Post -op Vital signs reviewed and stable  Post vital signs: Reviewed and stable  Last Vitals:  Vitals Value Taken Time  BP 132/53 09/30/18 1625  Temp 36.7 C 09/30/18 1625  Pulse 130 09/30/18 1625  Resp 20 09/30/18 1625  SpO2 100 % 09/30/18 1625    Last Pain:  Vitals:   09/30/18 1625  TempSrc: Oral  PainSc: Asleep         Complications: No apparent anesthesia complications

## 2018-10-01 ENCOUNTER — Encounter (HOSPITAL_COMMUNITY): Admission: AD | Disposition: A | Discharge status: Home / Self Care | Payer: Self-pay | Attending: Internal Medicine

## 2018-10-01 ENCOUNTER — Inpatient Hospital Stay (HOSPITAL_COMMUNITY): Payer: Medicaid Other | Admitting: Anesthesiology

## 2018-10-01 HISTORY — PX: CHOLECYSTECTOMY: SHX55

## 2018-10-01 LAB — CBC
HCT: 38.4 % (ref 36.0–46.0)
Hemoglobin: 13.2 g/dL (ref 12.0–15.0)
MCH: 29.5 pg (ref 26.0–34.0)
MCHC: 34.4 g/dL (ref 30.0–36.0)
MCV: 85.7 fL (ref 80.0–100.0)
Platelets: 262 10*3/uL (ref 150–400)
RBC: 4.48 MIL/uL (ref 3.87–5.11)
RDW: 13 % (ref 11.5–15.5)
WBC: 4.9 10*3/uL (ref 4.0–10.5)
nRBC: 0 % (ref 0.0–0.2)

## 2018-10-01 LAB — COMPREHENSIVE METABOLIC PANEL
ALT: 311 U/L — ABNORMAL HIGH (ref 0–44)
AST: 127 U/L — ABNORMAL HIGH (ref 15–41)
Albumin: 3.6 g/dL (ref 3.5–5.0)
Alkaline Phosphatase: 163 U/L — ABNORMAL HIGH (ref 38–126)
Anion gap: 12 (ref 5–15)
BUN: 5 mg/dL — ABNORMAL LOW (ref 6–20)
CO2: 20 mmol/L — ABNORMAL LOW (ref 22–32)
Calcium: 9.5 mg/dL (ref 8.9–10.3)
Chloride: 107 mmol/L (ref 98–111)
Creatinine, Ser: 0.73 mg/dL (ref 0.44–1.00)
GFR calc Af Amer: >60 mL/min (ref >60)
GFR calc non Af Amer: >60 mL/min (ref >60)
Glucose, Bld: 112 mg/dL — ABNORMAL HIGH (ref 70–99)
Potassium: 4.3 mmol/L (ref 3.5–5.1)
Sodium: 139 mmol/L (ref 135–145)
Total Bilirubin: 3 mg/dL — ABNORMAL HIGH (ref 0.3–1.2)
Total Protein: 6.7 g/dL (ref 6.5–8.1)

## 2018-10-01 SURGERY — LAPAROSCOPIC CHOLECYSTECTOMY
Anesthesia: General | Site: Abdomen

## 2018-10-01 MED ORDER — DEXAMETHASONE SODIUM PHOSPHATE 10 MG/ML IJ SOLN
INTRAMUSCULAR | Status: DC | PRN
  Administered 2018-10-01: 10 mg via INTRAVENOUS

## 2018-10-01 MED ORDER — HYDROMORPHONE HCL 1 MG/ML IJ SOLN
INTRAMUSCULAR | Status: AC
  Filled 2018-10-01: qty 1

## 2018-10-01 MED ORDER — KETOROLAC TROMETHAMINE 30 MG/ML IJ SOLN
INTRAMUSCULAR | Status: DC | PRN
  Administered 2018-10-01: 30 mg via INTRAVENOUS

## 2018-10-01 MED ORDER — PROPOFOL 10 MG/ML IV BOLUS
INTRAVENOUS | Status: DC | PRN
  Administered 2018-10-01: 200 mg via INTRAVENOUS

## 2018-10-01 MED ORDER — ROCURONIUM BROMIDE 10 MG/ML (PF) SYRINGE
PREFILLED_SYRINGE | INTRAVENOUS | Status: DC | PRN
  Administered 2018-10-01: 40 mg via INTRAVENOUS

## 2018-10-01 MED ORDER — PROPOFOL 10 MG/ML IV BOLUS
INTRAVENOUS | Status: AC
  Filled 2018-10-01: qty 20

## 2018-10-01 MED ORDER — ACETAMINOPHEN 10 MG/ML IV SOLN
INTRAVENOUS | Status: AC
  Filled 2018-10-01: qty 100

## 2018-10-01 MED ORDER — SUCCINYLCHOLINE CHLORIDE 200 MG/10ML IV SOSY
PREFILLED_SYRINGE | INTRAVENOUS | Status: DC | PRN
  Administered 2018-10-01: 100 mg via INTRAVENOUS

## 2018-10-01 MED ORDER — BUPIVACAINE-EPINEPHRINE (PF) 0.25% -1:200000 IJ SOLN
INTRAMUSCULAR | Status: AC
  Filled 2018-10-01: qty 30

## 2018-10-01 MED ORDER — SUGAMMADEX SODIUM 200 MG/2ML IV SOLN
INTRAVENOUS | Status: DC | PRN
  Administered 2018-10-01: 150 mg via INTRAVENOUS

## 2018-10-01 MED ORDER — ACETAMINOPHEN 325 MG PO TABS
650.0000 mg | ORAL_TABLET | Freq: Four times a day (QID) | ORAL | Status: DC
  Administered 2018-10-01 – 2018-10-02 (×3): 650 mg via ORAL
  Filled 2018-10-01 (×3): qty 2

## 2018-10-01 MED ORDER — SCOPOLAMINE 1 MG/3DAYS TD PT72
1.0000 | MEDICATED_PATCH | TRANSDERMAL | Status: DC
  Administered 2018-10-01: 1.5 mg via TRANSDERMAL

## 2018-10-01 MED ORDER — IBUPROFEN 200 MG PO TABS: 600.0000 mg | ORAL_TABLET | Freq: Four times a day (QID) | ORAL | Status: DC | PRN

## 2018-10-01 MED ORDER — SODIUM CHLORIDE 0.9 % IR SOLN
Status: DC | PRN
  Administered 2018-10-01: 1000 mL

## 2018-10-01 MED ORDER — FENTANYL CITRATE (PF) 100 MCG/2ML IJ SOLN
INTRAMUSCULAR | Status: DC | PRN
  Administered 2018-10-01: 75 ug via INTRAVENOUS
  Administered 2018-10-01: 50 ug via INTRAVENOUS
  Administered 2018-10-01: 75 ug via INTRAVENOUS
  Administered 2018-10-01 (×2): 25 ug via INTRAVENOUS

## 2018-10-01 MED ORDER — FENTANYL CITRATE (PF) 250 MCG/5ML IJ SOLN
INTRAMUSCULAR | Status: AC
  Filled 2018-10-01: qty 5

## 2018-10-01 MED ORDER — CEFAZOLIN SODIUM-DEXTROSE 2-3 GM-%(50ML) IV SOLR
INTRAVENOUS | Status: DC | PRN
  Administered 2018-10-01: 2 g via INTRAVENOUS

## 2018-10-01 MED ORDER — HYDROMORPHONE HCL 1 MG/ML IJ SOLN
0.2500 mg | INTRAMUSCULAR | Status: DC | PRN
  Administered 2018-10-01: 0.5 mg via INTRAVENOUS

## 2018-10-01 MED ORDER — TRAMADOL HCL 50 MG PO TABS
50.0000 mg | ORAL_TABLET | Freq: Four times a day (QID) | ORAL | Status: DC | PRN
  Administered 2018-10-01 – 2018-10-02 (×3): 50 mg via ORAL
  Filled 2018-10-01 (×3): qty 1

## 2018-10-01 MED ORDER — LIDOCAINE 2% (20 MG/ML) 5 ML SYRINGE
INTRAMUSCULAR | Status: DC | PRN
  Administered 2018-10-01: 70 mg via INTRAVENOUS

## 2018-10-01 MED ORDER — CEFAZOLIN SODIUM-DEXTROSE 2-4 GM/100ML-% IV SOLN
2.0000 g | Freq: Once | INTRAVENOUS | Status: DC
  Filled 2018-10-01: qty 100

## 2018-10-01 MED ORDER — MIDAZOLAM HCL 2 MG/2ML IJ SOLN
INTRAMUSCULAR | Status: AC
  Filled 2018-10-01: qty 2

## 2018-10-01 MED ORDER — PROPOFOL 1000 MG/100ML IV EMUL
INTRAVENOUS | Status: AC
  Filled 2018-10-01: qty 100

## 2018-10-01 MED ORDER — ONDANSETRON HCL 4 MG/2ML IJ SOLN
INTRAMUSCULAR | Status: DC | PRN
  Administered 2018-10-01: 4 mg via INTRAVENOUS

## 2018-10-01 MED ORDER — SCOPOLAMINE 1 MG/3DAYS TD PT72
MEDICATED_PATCH | TRANSDERMAL | Status: AC
  Administered 2018-10-01: 1.5 mg via TRANSDERMAL
  Filled 2018-10-01: qty 1

## 2018-10-01 MED ORDER — 0.9 % SODIUM CHLORIDE (POUR BTL) OPTIME
TOPICAL | Status: DC | PRN
  Administered 2018-10-01: 1000 mL

## 2018-10-01 MED ORDER — MIDAZOLAM HCL 5 MG/5ML IJ SOLN
INTRAMUSCULAR | Status: DC | PRN
  Administered 2018-10-01: 2 mg via INTRAVENOUS

## 2018-10-01 MED ORDER — BUPIVACAINE-EPINEPHRINE 0.25% -1:200000 IJ SOLN
INTRAMUSCULAR | Status: DC | PRN
  Administered 2018-10-01: 11 mL

## 2018-10-01 MED ORDER — PROMETHAZINE HCL 25 MG/ML IJ SOLN: 6.2500 mg | INTRAMUSCULAR | Status: DC | PRN

## 2018-10-01 MED ORDER — PROPOFOL 500 MG/50ML IV EMUL
INTRAVENOUS | Status: DC | PRN
  Administered 2018-10-01: 50 ug/kg/min via INTRAVENOUS

## 2018-10-01 SURGICAL SUPPLY — 37 items
APPLIER CLIP 5 13 M/L LIGAMAX5 (MISCELLANEOUS) ×3
BLADE CLIPPER SURG (BLADE) IMPLANT
CANISTER SUCT 3000ML PPV (MISCELLANEOUS) IMPLANT
CHLORAPREP W/TINT 26 (MISCELLANEOUS) ×3 IMPLANT
CLIP APPLIE 5 13 M/L LIGAMAX5 (MISCELLANEOUS) ×1 IMPLANT
COVER SURGICAL LIGHT HANDLE (MISCELLANEOUS) ×3 IMPLANT
COVER WAND RF STERILE (DRAPES) IMPLANT
DERMABOND ADVANCED (GAUZE/BANDAGES/DRESSINGS) ×2
DERMABOND ADVANCED .7 DNX12 (GAUZE/BANDAGES/DRESSINGS) ×1 IMPLANT
DISSECTOR BLUNT TIP ENDO 5MM (MISCELLANEOUS) IMPLANT
ELECT CAUTERY BLADE 6.4 (BLADE) ×3 IMPLANT
ELECT REM PT RETURN 9FT ADLT (ELECTROSURGICAL) ×3
ELECTRODE REM PT RTRN 9FT ADLT (ELECTROSURGICAL) ×1 IMPLANT
GLOVE BIO SURGEON STRL SZ7.5 (GLOVE) ×3 IMPLANT
GLOVE INDICATOR 8.0 STRL GRN (GLOVE) ×3 IMPLANT
GOWN STRL REUS W/ TWL LRG LVL3 (GOWN DISPOSABLE) ×3 IMPLANT
GOWN STRL REUS W/ TWL XL LVL3 (GOWN DISPOSABLE) ×1 IMPLANT
GOWN STRL REUS W/TWL LRG LVL3 (GOWN DISPOSABLE) ×6
GOWN STRL REUS W/TWL XL LVL3 (GOWN DISPOSABLE) ×2
KIT BASIN OR (CUSTOM PROCEDURE TRAY) ×3 IMPLANT
KIT TURNOVER KIT B (KITS) ×3 IMPLANT
NS IRRIG 1000ML POUR BTL (IV SOLUTION) ×3 IMPLANT
PAD ARMBOARD 7.5X6 YLW CONV (MISCELLANEOUS) ×3 IMPLANT
PENCIL SMOKE EVACUATOR (MISCELLANEOUS) ×3 IMPLANT
POUCH SPECIMEN RETRIEVAL 10MM (ENDOMECHANICALS) ×3 IMPLANT
SCISSORS LAP 5X35 DISP (ENDOMECHANICALS) ×3 IMPLANT
SET IRRIG TUBING LAPAROSCOPIC (IRRIGATION / IRRIGATOR) ×3 IMPLANT
SET TUBE SMOKE EVAC HIGH FLOW (TUBING) ×3 IMPLANT
SLEEVE ENDOPATH XCEL 5M (ENDOMECHANICALS) ×6 IMPLANT
SPECIMEN JAR SMALL (MISCELLANEOUS) ×3 IMPLANT
SUT MNCRL AB 4-0 PS2 18 (SUTURE) ×3 IMPLANT
TOWEL GREEN STERILE (TOWEL DISPOSABLE) ×3 IMPLANT
TOWEL GREEN STERILE FF (TOWEL DISPOSABLE) ×3 IMPLANT
TRAY LAPAROSCOPIC MC (CUSTOM PROCEDURE TRAY) ×3 IMPLANT
TROCAR XCEL BLUNT TIP 100MML (ENDOMECHANICALS) ×3 IMPLANT
TROCAR XCEL NON-BLD 5MMX100MML (ENDOMECHANICALS) ×3 IMPLANT
WATER STERILE IRR 1000ML POUR (IV SOLUTION) ×3 IMPLANT

## 2018-10-01 NOTE — Transfer of Care (Signed)
Immediate Anesthesia Transfer of Care Note  Patient: Kayla Edwards  Procedure(s) Performed: LAPAROSCOPIC CHOLECYSTECTOMY (N/A Abdomen)  Patient Location: PACU  Anesthesia Type:General  Level of Consciousness: awake, alert , oriented and patient cooperative  Airway & Oxygen Therapy: Patient Spontanous Breathing and Patient connected to nasal cannula oxygen  Post-op Assessment: Report given to RN and Post -op Vital signs reviewed and stable  Post vital signs: Reviewed and stable  Last Vitals:  Vitals Value Taken Time  BP 136/85 10/01/18 0937  Temp    Pulse 108 10/01/18 0937  Resp 16 10/01/18 0937  SpO2 98 % 10/01/18 0937  Vitals shown include unvalidated device data.  Last Pain:  Vitals:   10/01/18 0527  TempSrc: Oral  PainSc:          Complications: No apparent anesthesia complications

## 2018-10-01 NOTE — Addendum Note (Signed)
Addendum  created 10/01/18 1153 by Duane Boston, MD   Clinical Note Signed

## 2018-10-01 NOTE — Progress Notes (Signed)
Late entry: While in Short Stay patient told me she came down to be gotten ready to have gallbladder surgery but reported she had not spoken to surgeon yet.

## 2018-10-01 NOTE — Op Note (Signed)
09/29/2018 - 10/01/2018 9:17 AM  PATIENT: Kayla Edwards  23 y.o. female  Patient Care Team: Patient, No Pcp Per as PCP - General (General Practice)  PRE-OPERATIVE DIAGNOSIS: History of choledocholithiasis  POST-OPERATIVE DIAGNOSIS: Same  PROCEDURE: Laparoscopic cholecystectomy  SURGEON: Stephanie CoupChristopher M. Payden Docter, MD  ASSISTANT: OR staff  ANESTHESIA: General endotracheal  EBL: 5mL  EBL: Total I/O In: -  Out: 5 [Blood:5]  DRAINS: None  SPECIMEN: Gallbladder  COUNTS: Sponge, needle and instrument counts were reported correct x2 at the conclusion of the operation  DISPOSITION: PACU in satisfactory condition  COMPLICATIONS: None  FINDINGS: Mildly inflamed gallbladder with thickened peritoneum. Critical view of safety was obtained prior to clipping or dividing any structures.  INDICATION: [name] is a very pleasant 22yoF with hx of depression and GERD whom presented to the ED with epigastric abdominal pain that radiates to the back.  She states that this was after [place].  She states that she was discharged from the ER with normal laboratory studies.  She stated that thereafter she noticed some scleral icterus and some darkening of her urine.  Secondary to the above noted changes she presented to the ER for further evaluation. Upon evaluation, she underwent ultrasound, CT scan and MRCP. She was found to have gallstones as well as choledocholithiasis. She was admitted and GI consulted. She went for ERCP 09/30/2018 with clearance of the bile duct of stones. She recovered well from this without any abdominal pain. On 10/01/2018 she was ready for surgery for her gallbladder to be removed.  I had seen and examined her.  We discussed her diagnosis at length.  We discussed the relevant anatomy, physiology and pathophysiology.  We discussed options for treatment moving forward and she opted to pursue surgery.  We discussed the procedure, material risks, benefits and alternatives at length.   After considering all options and answering her questions, she opted to proceed with surgery. She had asked I update her grandmother, [name], at conclusion of the procedure.  DESCRIPTION: The patient was identified & brought into the operating room. She was then positioned supine on the OR table. SCDs were in place and active during the entire case. She then underwent general endotracheal anesthesia. Pressure points were padded. Hair on the abdomen was clipped by the OR team. The abdomen was prepped and draped in the standard sterile fashion. Antibiotics were administered. A surgical timeout was performed and confirmed our plan.  At this point her consent had been signed by both myself as well as the anesthesiologist.  It was noted that the patient and the witness had not physically signed the form.  I had had a lengthy discussion with her about her case as noted above and in the EMR prior to surgery as well as all potential material risks benefits and alternatives.  She had verbally agreed to proceed with surgery. I discussed the situation with one of my partners. Additionally, nursing and anesthesiology were involved and given this and the fact that she was now endotracheally intubated, we opted to proceed with her procedure knowing that she had been given appropriate consent and had elected to proceed preoperatively.  A small infraumbilical incision was made. The umbilical stalk was grasped and retracted outwardly. The infraumbilical fascia was identified and incised. The peritoneal cavity was gently entered bluntly. A purse-string 0 Vicryl suture was placed. The Hasson cannula was inserted into the peritoneal cavity and insufflation with CO2 commenced to 15mmHg. A laparoscope was inserted into the peritoneal cavity and  inspection confirmed no evidence of trocar site complications. The patient was then positioned in reverse Trendelenburg with slight left side down. 3 additional 33mm trocars were placed along  the right subcostal line - one 56mm port in mid subcostal region, another 1mm port in the right flank near the anterior axillary line, and a third 27mm port in the left subxiphoid region obliquely near the falciform ligament.  The liver and gallbladder were inspected. The gallbladder was mildly inflamed in appearance. The gallbladder fundus was grasped and elevated cephalad. An additional grasper was then placed on the infundibulum of the gallbladder and the infundibulum was retracted laterally. Staying high on the gallbladder, the peritoneum on both sides of the gallbladder was opened with hook cautery. Gentle blunt dissection was then employed with a IT consultant working down into [employer]. The cystic duct was identified and carefully circumferentially dissected. The cystic artery was also identified and carefully circumferentially dissected. The space between the cystic artery and hepatocystic plate was developed such that a good view of the liver could be seen through a window medial to the cystic artery. The triangle of Calot had been cleared of all fibrofatty tissue. At this point, a critical view of safety was achieved and the only structures visualized was the skeletonized cystic duct laterally, the skeletonized cystic artery and the liver through the window medial to the artery. No posterior cystic artery was noted  A cholangiogram was not performed at this point as the cystic duct appeared relatively small and also somewhat short - increasing potential risk of a "back-wall" injury of common bile duct.  The cystic duct and artery were clipped with 2 clips on the patient side and 1 clip on the specimen side. The cystic duct and artery were then divided. The gallbladder was then freed from its remaining attachments to the liver using electrocautery and placed into an endocatch bag. The RUQ was gently irrigated with sterile saline. Hemostasis was then verified. The clips were in good  position; the gallbladder fossa was dry. The rest of the abdomen was inspected no injury nor bleeding elsewhere was identified.  The endocatch bag containing the gallbladder was then removed from the umbilical port site and passed off as specimen. The RUQ ports were removed under direct visualization and noted to be hemostatic. The umbilical fascia was then closed using the 0 Vicryl purse-string suture. The fascia was palpated and noted to be completely closed. The skin of all incision sites was approximated with 4-0 monocryl subcuticular suture and dermabond applied. She was then awakened from anesthesia, extubated, and transferred to a stretcher for transport to PACU in satisfactory condition.  I updated her grandmother, Precious Bard, over the phone as per her request. We additionally discussed/disclosed the issue surrounding the clerical error that had occurred in her physically signing the procedural consent but having given clear verbal consent following a lengthy informed consent process.

## 2018-10-01 NOTE — Progress Notes (Signed)
PROGRESS NOTE    Kayla Edwards  DCV:[id number] DOB: [birth year] DOA: 09/29/2018 PCP: Patient, No Pcp Per    Brief Narrative:   Kayla Edwards is a 23 y.o. female with medical history significant for GERD and tobacco use who presented to the [facility] ED for evaluation of abdominal pain.  Patient was initially seen at [facility] ED on 09/27/2018 felt was severe attack of her acid reflux.  She had a RUQ abdominal ultrasound performed which showed multiple gallstones up to 1.4 cm without evidence of cholecystitis or biliary distention.  CBD diameter was 4.8 mm.  She returned on 09/29/2018 after she noticed yellowing of her eyes and orange discoloration of her urine.  She was not having much pain or nausea/vomiting.  [facility] course:  Vitals on discharge showed BP 128/85, pulse 84, RR 18, temp 97.3 Fahrenheit, SPO2 98% on room air. Labs were notable for AST 282, ALT 498, alk phos 188, total bilirubin 5.5, BUN 8, creatinine 0.83, WBC 5.0, hemoglobin 13.6, platelets 314,000, TSH 2.002 hemoglobin A1c 4.9%, acetaminophen level undetectable. Acute hepatitis panel was obtained and pending.  Urinalysis was negative for UTI and urine pregnancy test was negative. SARS-CoV-2 test was negative on 09/29/2018. CT abdomen/pelvis without contrast was obtained which did not show acute abnormality.  General surgery were consulted and were concerned about choledocholithiasis.  GI were consulted and recommended MRCP. MRCP was obtained and showed choledocholithiasis with mild biliary duct dilatation and cholelithiasis.  Mild hepatic steatosis also noted.  Due to no availability to perform ERCP at [facility], patient was transferred to [place]:   Active Problems:   Cholelithiasis with choledocholithiasis   Tobacco use  Choledocholithiasis and cholelithiasis: MRCP on 09/29/2018 notable for choledocholithiasis with biliary duct dilation and cholelithiasis.  Afebrile without leukocytosis. Patient currently  hemodynamically stable without suspicion for cholangitis or pancreatitis. Gastroenterology was consulted and patient underwent ERCP with removal of stone fragments on 09/30/2018.  General surgery was also consulted and patient underwent laparoscopic cholecystectomy on 10/01/2018. --GI and general surgery following, appreciate assistance --Continue pain control prn and antiemetics --repeat CBC and CMP in am  Tobacco use: Smoking 1 PPD.  She is motivated to quit and feels that the nicotine patch is beneficial.   DVT prophylaxis: SCDs  Code Status: Full code Family Communication: None Disposition Plan: Continue inpatient, likely d/c home 8/9   Consultants:   [facility]  General surgery  Procedures:   ERCP 09/30/2018 - Dr. Rush Landmark  Laparoscopic cholecystectomy 10/01/2018 - Dr. Dema Severin  Antimicrobials:      Subjective: Patient seen and examined at bedside, resting comfortably.  Just returned from PACU.  Currently eating lunch without issue.  No other complaints or concerns at this time.  Denies headache, no fever/chills/night sweats, no nausea vomit/diarrhea, no abdominal pain, no chest pain, no palpitations, no issues with bowel/bladder function, no paresthesia.  No acute events overnight per nursing staff.  Objective: Vitals:   10/01/18 0527 10/01/18 0940 10/01/18 0955 10/01/18 1010  BP: 120/68 136/85 127/75 125/70  Pulse: 76 (!) 120 (!) 102 91  Resp:  _0 Temp: 98.9 F (37.2 C) 98.4 F (36.9 C)    TempSrc: Oral     SpO2: 99% 98% 94% 92%    Intake/Output Summary (Last 24 hours) at 10/01/2018 1420 Last data filed at 10/01/2018 0930 Gross per 24 hour  Intake 3021.9 ml  Output 5 ml  Net 3016.9 ml   There were no vitals filed for this visit.  Examination:  General exam: Appears calm and comfortable  Respiratory system: Clear to auscultation. Respiratory effort normal. Cardiovascular system: S1 & S2 heard, RRR. No JVD, murmurs, rubs, gallops or clicks. No pedal  edema. Gastrointestinal system: Abdomen is nondistended, non-tender, soft. No organomegaly or masses felt. Normal bowel sounds heard. Central nervous system: Alert and oriented. No focal neurological deficits. Extremities: Symmetric 5 x 5 power. Skin: No rashes, lesions or ulcers Psychiatry: Judgement and insight appear normal. Mood & affect appropriate.     Data Reviewed: I have personally reviewed following labs and imaging studies  CBC: Recent Labs  Lab 09/27/18 0231 09/29/18 0011 09/30/18 0249 10/01/18 0327  WBC 10.3 5.0 4.9 4.9  NEUTROABS 7.2  --   --   --   HGB 13.3 13.6 12.4 13.2  HCT 39.6 40.7 37.4 38.4  MCV 86.5 86.2 88.8 85.7  PLT 359 314 289 130   Basic Metabolic Panel: Recent Labs  Lab 09/27/18 0231 09/29/18 0011 09/30/18 0249 10/01/18 0327  NA 140 137 138 139  K 3.8 3.5 3.9 4.3  CL 107 104 108 107  CO2 26 23 19* 20*  GLUCOSE 103* 128* 81 112*  BUN _0 5*  CREATININE 0.71 0.83 0.73 0.73  CALCIUM 9.5 9.8 9.0 9.5   GFR: Estimated Creatinine Clearance: 111.9 mL/min (by C-G formula based on SCr of 0.73 mg/dL). Liver Function Tests: Recent Labs  Lab 09/27/18 0231 09/29/18 0011 09/30/18 0249 10/01/18 0327  AST 125* 282* 167* 127*  ALT 91* 498* 337* 311*  ALKPHOS 104 188* 150* 163*  BILITOT 0.5 5.5* 2.8* 3.0*  PROT 7.5 8.0 6.3* 6.7  ALBUMIN 4.3 4.5 3.5 3.6   Recent Labs  Lab 09/27/18 0231  LIPASE 54*   No results for input(s): AMMONIA in the last 168 hours. Coagulation Profile: No results for input(s): INR, PROTIME in the last 168 hours. Cardiac Enzymes: No results for input(s): CKTOTAL, CKMB, CKMBINDEX, TROPONINI in the last 168 hours. BNP (last 3 results) No results for input(s): PROBNP in the last 8760 hours. HbA1C: Recent Labs    09/29/18 0701  HGBA1C 4.9   CBG: No results for input(s): GLUCAP in the last 168 hours. Lipid Profile: No results for input(s): CHOL, HDL, LDLCALC, TRIG, CHOLHDL, LDLDIRECT in the last 72  hours. Thyroid Function Tests: Recent Labs    09/29/18 0701  TSH 2.002   Anemia Panel: No results for input(s): VITAMINB12, FOLATE, FERRITIN, TIBC, IRON, RETICCTPCT in the last 72 hours. Sepsis Labs: No results for input(s): PROCALCITON, LATICACIDVEN in the last 168 hours.  Recent Results (from the past 240 hour(s))  SARS Coronavirus 2 [facility] order, Performed in [facility] hospital lab) Nasopharyngeal Nasopharyngeal Swab     Status: None   Collection Time: 09/29/18  1:34 AM   Specimen: Nasopharyngeal Swab  Result Value Ref Range Status   SARS Coronavirus 2 NEGATIVE NEGATIVE Final    Comment: (NOTE) If result is NEGATIVE SARS-CoV-2 target nucleic acids are NOT DETECTED. The SARS-CoV-2 RNA is generally detectable in upper and lower  respiratory specimens during the acute phase of infection. The lowest  concentration of SARS-CoV-2 viral copies this assay can detect is 250  copies / mL. A negative result does not preclude SARS-CoV-2 infection  and should not be used as the sole basis for treatment or other  patient management decisions.  A negative result may occur with  improper specimen collection / handling, submission of specimen other  than nasopharyngeal swab, presence of viral mutation(s) within  the  areas targeted by this assay, and inadequate number of viral copies  (<250 copies / mL). A negative result must be combined with clinical  observations, patient history, and epidemiological information. If result is POSITIVE SARS-CoV-2 target nucleic acids are DETECTED. The SARS-CoV-2 RNA is generally detectable in upper and lower  respiratory specimens dur ing the acute phase of infection.  Positive  results are indicative of active infection with SARS-CoV-2.  Clinical  correlation with patient history and other diagnostic information is  necessary to determine patient infection status.  Positive results do  not rule out bacterial infection or co-infection with other  viruses. If result is PRESUMPTIVE POSTIVE SARS-CoV-2 nucleic acids MAY BE PRESENT.   A presumptive positive result was obtained on the submitted specimen  and confirmed on repeat testing.  While 2019 novel coronavirus  (SARS-CoV-2) nucleic acids may be present in the submitted sample  additional confirmatory testing may be necessary for epidemiological  and / or clinical management purposes  to differentiate between  SARS-CoV-2 and other Sarbecovirus currently known to infect humans.  If clinically indicated additional testing with an alternate test  methodology [phone]) is advised. The SARS-CoV-2 RNA is generally  detectable in upper and lower respiratory sp ecimens during the acute  phase of infection. The expected result is Negative. Fact Sheet for Patients:  StrictlyIdeas.no Fact Sheet for Healthcare Providers: BankingDealers.co.za This test is not yet approved or cleared by the [place] FDA and has been authorized for detection and/or diagnosis of SARS-CoV-2 by FDA under an Emergency Use Authorization (EUA).  This EUA will remain in effect (meaning this test can be used) for the duration of the COVID-19 declaration under Section 564(b)(1) of the Act, 21 U.S.C. section 360bbb-3(b)(1), unless the authorization is terminated or revoked sooner. Performed at [facility], [address], [place], [place] [address]          Radiology Studies: Dg Ercp Biliary & Pancreatic Ducts  Result Date: 09/30/2018 CLINICAL DATA:  Choledocholithiasis. EXAM: ERCP TECHNIQUE: Multiple spot images obtained with the fluoroscopic device and submitted for interpretation post-procedure. COMPARISON:  MRCP on 09/29/2018 FINDINGS: Images obtained with a C-arm demonstrate cannulation of the common bile duct with balloon sweep maneuver performed. IMPRESSION: Balloon sweep maneuver performed to treat choledocholithiasis. These images were submitted  for radiologic interpretation only. Please see the procedural report for the amount of contrast and the fluoroscopy time utilized. Electronically Signed   By: Aletta Edouard M.D.   On: 09/30/2018 16:42   Mr Abdomen Mrcp Moise Boring Contast  Result Date: 09/29/2018 CLINICAL DATA:  Jaundice.  Known gallstones. EXAM: MRI ABDOMEN WITHOUT AND WITH CONTRAST (INCLUDING MRCP) TECHNIQUE: Multiplanar multisequence MR imaging of the abdomen was performed both before and after the administration of intravenous contrast. Heavily T2-weighted images of the biliary and pancreatic ducts were obtained, and three-dimensional MRCP images were rendered by post processing. CONTRAST:  7 cc Gadavist COMPARISON:  CT of earlier in the day.  Ultrasound of 09/27/2018. FINDINGS: Lower chest: Normal heart size without pericardial or pleural effusion. Hepatobiliary: Mild hepatic steatosis. No focal liver lesion. Multiple gallstones including at up to 1.0 cm. No evidence of acute cholecystitis. Mild intrahepatic biliary duct dilatation. The common duct is mildly dilated, including at maximally 9 mm in the porta hepatis on 42/11. Two tiny (1-2 mm) distal common duct stones are identified including on 37/11 and 9/13. Pancreas:  Normal, without mass or ductal dilatation. Spleen:  Normal in size, without focal abnormality. Adrenals/Urinary Tract: Normal  adrenal glands. Normal kidneys, without hydronephrosis. Stomach/Bowel: Normal stomach and abdominal bowel loops. Vascular/Lymphatic: Normal caliber of the aorta and branch vessels. No abdominal adenopathy. Other:  No ascites. Musculoskeletal: No acute osseous abnormality. IMPRESSION: 1. Choledocholithiasis with mild biliary duct dilatation. 2. Cholelithiasis. 3. Mild hepatic steatosis. Electronically Signed   By: Abigail Miyamoto M.D.   On: 09/29/2018 15:20        Scheduled Meds:  acetaminophen  650 mg Oral Q6H   HYDROmorphone       nicotine  21 mg Transdermal Daily   pantoprazole  40 mg Oral  Daily   Continuous Infusions:  lactated ringers 125 mL/hr at 10/01/18 0133     LOS: 2 days    Time spent: 26 minutes spent on chart review, discussion with nursing staff, consultants, updating family and interview/physical exam; more than 50% of that time was spent in counseling and/or coordination of care.    Leightyn Cina J [place] DO [facility] Pager [phone]  If 7PM-7AM, please contact night-coverage [email or web address] Password [email or web address] 10/01/2018, 2:20 PM

## 2018-10-01 NOTE — Anesthesia Postprocedure Evaluation (Signed)
Anesthesia Post Note  Patient: Kayla Edwards  Procedure(s) Performed: LAPAROSCOPIC CHOLECYSTECTOMY (N/A Abdomen)     Patient location during evaluation: PACU Anesthesia Type: General Level of consciousness: sedated Pain management: pain level controlled Vital Signs Assessment: post-procedure vital signs reviewed and stable Respiratory status: spontaneous breathing and respiratory function stable Cardiovascular status: stable Postop Assessment: no apparent nausea or vomiting Anesthetic complications: no    Last Vitals:  Vitals:   10/01/18 0955 10/01/18 1010  BP: 127/75 125/70  Pulse: (!) 102 91  Resp: 15 20  Temp:    SpO2: 94% 92%    Last Pain:  Vitals:   10/01/18 1015  TempSrc:   PainSc: 2                  Shine Scrogham DANIEL

## 2018-10-01 NOTE — Plan of Care (Signed)
  Problem: Education: Goal: Knowledge of General Education information will improve Description Including pain rating scale, medication(s)/side effects and non-pharmacologic comfort measures Outcome: Progressing   

## 2018-10-01 NOTE — Progress Notes (Signed)
Subjective No acute events. Underwent ERCP and has had uneventful recovery. Denies any abdominal pain, n/v. Feels well.  Objective: Vital signs in last 24 hours: Temp:  [97.6 F (36.4 C)-98.9 F (37.2 C)] 98.9 F (37.2 C) (08/08 0527) Pulse Rate:  [71-130] 76 (08/08 0527) Resp:  [13-21] 15 (08/07 1938) BP: (120-159)/(53-98) 120/68 (08/08 0527) SpO2:  [97 %-100 %] 99 % (08/08 0527) Last BM Date: 09/29/18  Intake/Output from previous day: 08/07 0701 - 08/08 0700 In: 2121.9 [P.O.:120; I.V.:2001.9] Out: -  Intake/Output this shift: No intake/output data recorded.  Gen: NAD, comfortable CV: RRR Pulm: Normal work of breathing Abd: Soft, NT/ND Ext: SCDs in place  Lab Results: CBC  Recent Labs    09/30/18 0249 10/01/18 0327  WBC 4.9 4.9  HGB 12.4 13.2  HCT 37.4 38.4  PLT 289 262   BMET Recent Labs    09/30/18 0249 10/01/18 0327  NA 138 139  K 3.9 4.3  CL 108 107  CO2 19* 20*  GLUCOSE 81 112*  BUN 6 5*  CREATININE 0.73 0.73  CALCIUM 9.0 9.5   PT/INR No results for input(s): LABPROT, INR in the last 72 hours. ABG No results for input(s): PHART, HCO3 in the last 72 hours.  Invalid input(s): PCO2, PO2  Studies/Results:  Anti-infectives: Anti-infectives (From admission, onward)   Start     Dose/Rate Route Frequency Ordered Stop   10/01/18 0745  ceFAZolin (ANCEF) IVPB 2g/100 mL premix     2 g 200 mL/hr over 30 Minutes Intravenous  Once 10/01/18 0744     09/30/18 1530  ciprofloxacin (CIPRO) IVPB 400 mg     400 mg 200 mL/hr over 60 Minutes Intravenous  Once 09/30/18 1518 09/30/18 1610       Assessment/Plan: Patient Active Problem List   Diagnosis Date Noted  . Tobacco use 09/30/2018  . Cholestatic jaundice 09/29/2018  . Cholelithiasis with choledocholithiasis 09/29/2018   Ms. Tavenner is a very pleasant 59yoF with hx of GERD - admitted with choledocholithiasis - underwent ERCP yesterday and has done well - duct was cleared  -The anatomy and physiology  of the hepatobiliary system was discussed at length with the patient today. The pathophysiology of gallbladder disease was discussed at length as well. -The options for treatment were discussed including ongoing observation which may result in recurrent and subsequent gallbladder complications (infection, pancreatitis, recurrent choledocholithiasis, etc). -We discussed laparoscopic cholecystectomy -The planned procedure, material risks (including, but not limited to, pain, bleeding, infection, scarring, need for blood transfusion, damage to surrounding structures- blood vessels/nerves/viscus/organs, damage to bile duct, bile leak, need for additional procedures, hernia, worsening of pre-existing medical conditions, pancreatitis, pneumonia, DVT/PE, heart attack, stroke, death) benefits and alternatives to surgery were discussed at length. I noted a good probability that the procedure would help improve her symptoms by reducing risks of future attacks. The patient's questions were answered to her satisfaction, she voiced understanding and they elected to proceed with surgery. Additionally, we discussed typical postoperative expectations and the recovery process.   LOS: 2 days   Sharon Mt. Dema Severin, M.D. [facility]

## 2018-10-01 NOTE — Anesthesia Procedure Notes (Signed)
Procedure Name: Intubation Date/Time: 10/01/2018 8:01 AM Performed by: Cleda Daub, CRNA Pre-anesthesia Checklist: Patient identified, Emergency Drugs available, Suction available and Patient being monitored Patient Re-evaluated:Patient Re-evaluated prior to induction Preoxygenation: Pre-oxygenation with 100% oxygen Induction Type: IV induction and Rapid sequence Laryngoscope Size: Miller and 2 Grade View: Grade I Tube type: Oral Tube size: 7.0 mm Number of attempts: 1 Airway Equipment and Method: Stylet Placement Confirmation: ETT inserted through vocal cords under direct vision,  positive ETCO2 and breath sounds checked- equal and bilateral Secured at: 21 cm Tube secured with: Tape Dental Injury: Teeth and Oropharynx as per pre-operative assessment

## 2018-10-01 NOTE — Plan of Care (Signed)
  Problem: Safety: Goal: Ability to remain free from injury will improve Outcome: Adequate for Discharge   

## 2018-10-01 NOTE — OR Nursing (Signed)
Consent not signed by patient.Patient verbalized understanding of procedure to Dr. Dema Severin. Dr. Dema Severin also noted in his progress note on 10/01/2018 at 0746 that patient had verbalized understanding and was willing to proceed with surgery.

## 2018-10-02 ENCOUNTER — Encounter (HOSPITAL_COMMUNITY): Payer: Self-pay | Admitting: Surgery

## 2018-10-02 LAB — CBC WITH DIFFERENTIAL/PLATELET
Abs Immature Granulocytes: 0.02 10*3/uL (ref 0.00–0.07)
Basophils Absolute: 0 10*3/uL (ref 0.0–0.1)
Basophils Relative: 0 %
Eosinophils Absolute: 0 10*3/uL (ref 0.0–0.5)
Eosinophils Relative: 0 %
HCT: 38.2 % (ref 36.0–46.0)
Hemoglobin: 12.7 g/dL (ref 12.0–15.0)
Immature Granulocytes: 0 %
Lymphocytes Relative: 34 %
Lymphs Abs: 3.1 10*3/uL (ref 0.7–4.0)
MCH: 29.4 pg (ref 26.0–34.0)
MCHC: 33.2 g/dL (ref 30.0–36.0)
MCV: 88.4 fL (ref 80.0–100.0)
Monocytes Absolute: 0.8 10*3/uL (ref 0.1–1.0)
Monocytes Relative: 8 %
Neutro Abs: 5.2 10*3/uL (ref 1.7–7.7)
Neutrophils Relative %: 58 %
Platelets: 265 10*3/uL (ref 150–400)
RBC: 4.32 MIL/uL (ref 3.87–5.11)
RDW: 13.2 % (ref 11.5–15.5)
WBC: 9.2 10*3/uL (ref 4.0–10.5)
nRBC: 0 % (ref 0.0–0.2)

## 2018-10-02 LAB — COMPREHENSIVE METABOLIC PANEL
ALT: 271 U/L — ABNORMAL HIGH (ref 0–44)
AST: 126 U/L — ABNORMAL HIGH (ref 15–41)
Albumin: 3.4 g/dL — ABNORMAL LOW (ref 3.5–5.0)
Alkaline Phosphatase: 130 U/L — ABNORMAL HIGH (ref 38–126)
Anion gap: 13 (ref 5–15)
BUN: 7 mg/dL (ref 6–20)
CO2: 24 mmol/L (ref 22–32)
Calcium: 9.1 mg/dL (ref 8.9–10.3)
Chloride: 104 mmol/L (ref 98–111)
Creatinine, Ser: 0.82 mg/dL (ref 0.44–1.00)
GFR calc Af Amer: >60 mL/min (ref >60)
GFR calc non Af Amer: >60 mL/min (ref >60)
Glucose, Bld: 89 mg/dL (ref 70–99)
Potassium: 3.9 mmol/L (ref 3.5–5.1)
Sodium: 141 mmol/L (ref 135–145)
Total Bilirubin: 1.7 mg/dL — ABNORMAL HIGH (ref 0.3–1.2)
Total Protein: 6 g/dL — ABNORMAL LOW (ref 6.5–8.1)

## 2018-10-02 MED ORDER — TRAMADOL HCL 50 MG PO TABS: 50.0000 mg | ORAL_TABLET | Freq: Four times a day (QID) | ORAL | 0 refills | Status: AC | PRN

## 2018-10-02 NOTE — Discharge Instructions (Signed)
[facility]  Please arrive at least 30 min before your appointment to complete your check in paperwork.  If you are unable to arrive 30 min prior to your appointment time we may have to cancel or reschedule you. LAPAROSCOPIC SURGERY: POST OP INSTRUCTIONS Always review your discharge instruction sheet given to you by the facility where your surgery was performed. IF YOU HAVE DISABILITY OR FAMILY LEAVE FORMS, YOU MUST BRING THEM TO THE OFFICE FOR PROCESSING.   DO NOT GIVE THEM TO YOUR DOCTOR.  PAIN CONTROL  1. First take acetaminophen (Tylenol) AND/or ibuprofen (Advil) to control your pain after surgery.  Follow directions on package.  Taking acetaminophen (Tylenol) and/or ibuprofen (Advil) regularly after surgery will help to control your pain and lower the amount of prescription pain medication you may need.  You should not take more than 4,000 mg (4 grams) of acetaminophen (Tylenol) in 24 hours.  You should not take ibuprofen (Advil), aleve, motrin, naprosyn or other NSAIDS if you have a history of stomach ulcers or chronic kidney disease.  2. A prescription for pain medication may be given to you upon discharge.  Take your pain medication as prescribed, if you still have uncontrolled pain after taking acetaminophen (Tylenol) or ibuprofen (Advil). 3. Use ice packs to help control pain. 4. If you need a refill on your pain medication, please contact your pharmacy.  They will contact our office to request authorization. Prescriptions will not be filled after 5pm or on week-ends.  HOME MEDICATIONS 5. Take your usually prescribed medications unless otherwise directed.  DIET 6. You should follow a light diet the first few days after arrival home.  Be sure to include lots of fluids daily. Avoid fatty, fried foods.   CONSTIPATION 7. It is common to experience some constipation after surgery and if you are taking pain medication.  Increasing fluid intake and taking a stool  softener (such as Colace) will usually help or prevent this problem from occurring.  A mild laxative (Milk of Magnesia or Miralax) should be taken according to package instructions if there are no bowel movements after 48 hours.  WOUND/INCISION CARE 8. Most patients will experience some swelling and bruising in the area of the incisions.  Ice packs will help.  Swelling and bruising can take several days to resolve.  9. Unless discharge instructions indicate otherwise, follow guidelines below  a. STERI-STRIPS - you may remove your outer bandages 48 hours after surgery, and you may shower at that time.  You have steri-strips (small skin tapes) in place directly over the incision.  These strips should be left on the skin for 7-10 days.   b. DERMABOND/SKIN GLUE - you may shower in 24 hours.  The glue will flake off over the next 2-3 weeks. 10. Any sutures or staples will be removed at the office during your follow-up visit.  ACTIVITIES 11. You may resume regular (light) daily activities beginning the next day--such as daily self-care, walking, climbing stairs--gradually increasing activities as tolerated.  You may have sexual intercourse when it is comfortable.  Refrain from any heavy lifting or straining until approved by your doctor. a. You may drive when you are no longer taking prescription pain medication, you can comfortably wear a seatbelt, and you can safely maneuver your car and apply brakes.  FOLLOW-UP 12. You should see your doctor in the office for a follow-up appointment approximately 2-3 weeks after your surgery.  You should have been given your post-op/follow-up appointment when   your surgery was scheduled.  If you did not receive a post-op/follow-up appointment, make sure that you call for this appointment within a day or two after you arrive home to insure a convenient appointment time.   WHEN TO CALL YOUR DOCTOR: 1. Fever over 101.0 2. Inability to urinate 3. Continued bleeding from  incision. 4. Increased pain, redness, or drainage from the incision. 5. Increasing abdominal pain  The clinic staff is available to answer your questions during regular business hours.  Please don't hesitate to call and ask to speak to one of the nurses for clinical concerns.  If you have a medical emergency, go to the nearest emergency room or call 911.  A surgeon from [facility] is always on call at the hospital. [address], [address], [place], [place]  [address] ? [address] [address], [place], [place]   [address] [phone] ? [phone] ? FAX [phone]  .........   Managing Your Pain After Surgery Without Opioids    Thank you for participating in our program to help patients manage their pain after surgery without opioids. This is part of our effort to provide you with the best care possible, without exposing you or your family to the risk that opioids pose.  What pain can I expect after surgery? You can expect to have some pain after surgery. This is normal. The pain is typically worse the day after surgery, and quickly begins to get better. Many studies have found that many patients are able to manage their pain after surgery with Over-the-Counter (OTC) medications such as Tylenol and Motrin. If you have a condition that does not allow you to take Tylenol or Motrin, notify your surgical team.  How will I manage my pain? The best strategy for controlling your pain after surgery is around the clock pain control with Tylenol (acetaminophen) and Motrin (ibuprofen or Advil). Alternating these medications with each other allows you to maximize your pain control. In addition to Tylenol and Motrin, you can use heating pads or ice packs on your incisions to help reduce your pain.  How will I alternate your regular strength over-the-counter pain medication? You will take a dose of pain medication every three hours. ; Start by taking 650 mg of Tylenol (2 pills of 325  mg) ; 3 hours later take 600 mg of Motrin (3 pills of 200 mg) ; 3 hours after taking the Motrin take 650 mg of Tylenol ; 3 hours after that take 600 mg of Motrin.   - 1 -  See example - if your first dose of Tylenol is at 12:00 PM   12:00 PM Tylenol 650 mg (2 pills of 325 mg)  3:00 PM Motrin 600 mg (3 pills of 200 mg)  6:00 PM Tylenol 650 mg (2 pills of 325 mg)  9:00 PM Motrin 600 mg (3 pills of 200 mg)  Continue alternating every 3 hours   We recommend that you follow this schedule around-the-clock for at least 3 days after surgery, or until you feel that it is no longer needed. Use the table on the last page of this handout to keep track of the medications you are taking. Important: Do not take more than 3000mg of Tylenol or 3200mg of Motrin in a 24-hour period. Do not take ibuprofen/Motrin if you have a history of bleeding stomach ulcers, severe kidney disease, &/or actively taking a blood thinner  What if I still have pain? If you have pain that is not   controlled with the over-the-counter pain medications (Tylenol and Motrin or Advil) you might have what we call "breakthrough" pain. You will receive a prescription for a small amount of an opioid pain medication such as Oxycodone, Tramadol, or Tylenol with Codeine. Use these opioid pills in the first 24 hours after surgery if you have breakthrough pain. Do not take more than 1 pill every 4-6 hours.  If you still have uncontrolled pain after using all opioid pills, don't hesitate to call our staff using the number provided. We will help make sure you are managing your pain in the best way possible, and if necessary, we can provide a prescription for additional pain medication.   Day 1    Time  Name of Medication Number of pills taken  Amount of Acetaminophen  Pain Level   Comments  AM PM       AM PM       AM PM       AM PM       AM PM       AM PM       AM PM       AM PM       Total Daily amount of Acetaminophen Do not  take more than  3,000 mg per day      Day 2    Time  Name of Medication Number of pills taken  Amount of Acetaminophen  Pain Level   Comments  AM PM       AM PM       AM PM       AM PM       AM PM       AM PM       AM PM       AM PM       Total Daily amount of Acetaminophen Do not take more than  3,000 mg per day      Day 3    Time  Name of Medication Number of pills taken  Amount of Acetaminophen  Pain Level   Comments  AM PM       AM PM       AM PM       AM PM          AM PM       AM PM       AM PM       AM PM       Total Daily amount of Acetaminophen Do not take more than  3,000 mg per day      Day 4    Time  Name of Medication Number of pills taken  Amount of Acetaminophen  Pain Level   Comments  AM PM       AM PM       AM PM       AM PM       AM PM       AM PM       AM PM       AM PM       Total Daily amount of Acetaminophen Do not take more than  3,000 mg per day      Day 5    Time  Name of Medication Number of pills taken  Amount of Acetaminophen  Pain Level   Comments  AM PM       AM PM       AM   PM       AM PM       AM PM       AM PM       AM PM       AM PM       Total Daily amount of Acetaminophen Do not take more than  3,000 mg per day       Day 6    Time  Name of Medication Number of pills taken  Amount of Acetaminophen  Pain Level  Comments  AM PM       AM PM       AM PM       AM PM       AM PM       AM PM       AM PM       AM PM       Total Daily amount of Acetaminophen Do not take more than  3,000 mg per day      Day 7    Time  Name of Medication Number of pills taken  Amount of Acetaminophen  Pain Level   Comments  AM PM       AM PM       AM PM       AM PM       AM PM       AM PM       AM PM       AM PM       Total Daily amount of Acetaminophen Do not take more than  3,000 mg per day        For additional information about how and where to safely dispose of unused  opioid medications - https://www.morepowerfulnc.org  Disclaimer: This document contains information and/or instructional materials adapted from [facility] for the typical patient with your condition. It does not replace medical advice from your health care provider because your experience may differ from that of the typical patient. Talk to your health care provider if you have any questions about this document, your condition or your treatment plan. Adapted from [facility]   

## 2018-10-02 NOTE — Progress Notes (Signed)
Discharge instructions/education addressed; pt in stable condition; wore mask;picked up by family at [employer] entrance.

## 2018-10-02 NOTE — Progress Notes (Signed)
Patient ID: Kayla Edwards, female   DOB: 1995-05-11, 22 y.o.   MRN: [id number]    1 Day Post-Op  Subjective: Just some pain in shoulder from gas.  Otherwise sore.  Tolerating a diet well.   Objective: Vital signs in last 24 hours: Temp:  [98.3 F (36.8 C)-98.5 F (36.9 C)] 98.5 F (36.9 C) (08/09 0423) Pulse Rate:  [73-77] 77 (08/09 0423) Resp:  [18] 18 (08/08 1954) BP: (92-132)/(47-79) 132/79 (08/09 0423) SpO2:  [96 %-98 %] 98 % (08/09 0423) Last BM Date: 09/29/18  Intake/Output from previous day: 08/08 0701 - 08/09 0700 In: 1500 [P.O.:600; I.V.:900] Out: 5 [Blood:5] Intake/Output this shift: No intake/output data recorded.  PE: Abd: soft, appropriately tender, +CS, ND, incisions c/d/i  Lab Results:  Recent Labs    10/01/18 0327 10/02/18 0544  WBC 4.9 9.2  HGB 13.2 12.7  HCT 38.4 38.2  PLT 262 265   BMET Recent Labs    10/01/18 0327 10/02/18 0544  NA 139 141  K 4.3 3.9  CL 107 104  CO2 20* 24  GLUCOSE 112* 89  BUN 5* 7  CREATININE 0.73 0.82  CALCIUM 9.5 9.1   PT/INR No results for input(s): LABPROT, INR in the last 72 hours. CMP     Component Value Date/Time   NA 141 10/02/2018 0544   K 3.9 10/02/2018 0544   CL 104 10/02/2018 0544   CO2 24 10/02/2018 0544   GLUCOSE 89 10/02/2018 0544   BUN 7 10/02/2018 0544   CREATININE 0.82 10/02/2018 0544   CALCIUM 9.1 10/02/2018 0544   PROT 6.0 (L) 10/02/2018 0544   ALBUMIN 3.4 (L) 10/02/2018 0544   AST 126 (H) 10/02/2018 0544   ALT 271 (H) 10/02/2018 0544   ALKPHOS 130 (H) 10/02/2018 0544   BILITOT 1.7 (H) 10/02/2018 0544   GFRNONAA >60 10/02/2018 0544   GFRAA >60 10/02/2018 0544   Lipase     Component Value Date/Time   LIPASE 54 (H) 09/27/2018 0231       Studies/Results: Dg Ercp Biliary & Pancreatic Ducts  Result Date: 09/30/2018 CLINICAL DATA:  Choledocholithiasis. EXAM: ERCP TECHNIQUE: Multiple spot images obtained with the fluoroscopic device and submitted for interpretation  post-procedure. COMPARISON:  MRCP on 09/29/2018 FINDINGS: Images obtained with a C-arm demonstrate cannulation of the common bile duct with balloon sweep maneuver performed. IMPRESSION: Balloon sweep maneuver performed to treat choledocholithiasis. These images were submitted for radiologic interpretation only. Please see the procedural report for the amount of contrast and the fluoroscopy time utilized. Electronically Signed   By: Aletta Edouard M.D.   On: 09/30/2018 16:42    Anti-infectives: Anti-infectives (From admission, onward)   Start     Dose/Rate Route Frequency Ordered Stop   10/01/18 0745  ceFAZolin (ANCEF) IVPB 2g/100 mL premix  Status:  Discontinued     2 g 200 mL/hr over 30 Minutes Intravenous  Once 10/01/18 0744 10/01/18 1014   09/30/18 1530  ciprofloxacin (CIPRO) IVPB 400 mg     400 mg 200 mL/hr over 60 Minutes Intravenous  Once 09/30/18 1518 09/30/18 1610       Assessment/Plan POD 1, s/p lap chole -ok for DC home from surgery standpoint -follow up being arranged for 3 weeks from discharge. Rx sent to pharmacy     LOS: 3 days    Henreitta Cea , [facility] 10/02/2018, 12:26 PM Pager: [phone]]

## 2018-10-02 NOTE — Discharge Summary (Signed)
Physician Discharge Summary  [name] SFS:[id number] DOB: Jul 28, 1995 DOA: 09/29/2018  PCP: Patient, No Pcp Per  Admit date: 09/29/2018 Discharge date: 10/02/2018  Admitted From: Home Disposition: Home  Recommendations for Outpatient Follow-up:  1. Follow up with PCP in 1-2 weeks; needs to establish care 2. Please obtain CMP at next visit to ensure liver function and bilirubin continue to improve 3. Please follow up on the following pending results: Surgical pathology from cholecystectomy and gastric biopsy for evaluation of H. pylori 4. Continue to encourage tobacco cessation  Home Health: No Equipment/Devices: None  Discharge Condition: Stable CODE STATUS: Full code Diet recommendation: Regular diet  History of present illness:  Kayla Edwards a 23 y.o.femalewith medical history significant forGERD and tobacco use who [facility] for evaluation of abdominal pain.  Patient was initially seen at [facility] ED on 09/27/2018 felt was severe attack of her acid reflux. She had a RUQ abdominal ultrasound performed which showed multiple gallstones up to 1.4 cm without evidence of cholecystitis or biliary distention. CBD diameter was 4.8 mm.  She returned on 09/29/2018 after she noticed yellowing of her eyes and orange discoloration of her urine. She was not having much pain or nausea/vomiting.  [facility] course: Vitals on discharge showed BP 128/85, pulse 84, RR 18, temp 97.3 Fahrenheit, SPO2 98% on room air. Labs were notable for AST 282, ALT 498, alk phos 188, total bilirubin 5.5, BUN 8, creatinine 0.83, WBC 5.0, hemoglobin 13.6, platelets 314,000, TSH 2.002 hemoglobin A1c 4.9%, acetaminophen level undetectable. Acute hepatitis panel was obtained and pending. Urinalysis was negative for UTI and urine pregnancy test was negative. SARS-CoV-2 test was negative on 09/29/2018. CT abdomen/pelvis without contrast was obtained which did not show acute abnormality.  General surgery  were consulted andwereconcerned about choledocholithiasis. GI were consulted and recommended MRCP. MRCP was obtained and showed choledocholithiasis with mild biliary duct dilatation and cholelithiasis. Mild hepatic steatosis also noted. Due to no availability to perform ERCP at [facility], patient was transferred to [facility] course:  Choledocholithiasisand cholelithiasis: MRCP on 09/29/2018 notable for choledocholithiasis with biliary duct dilation and cholelithiasis.  Afebrile without leukocytosis.Patient currently hemodynamically stable without suspicion for cholangitis or pancreatitis. Gastroenterology was consulted and patient underwent ERCP with removal of stone fragments on 09/30/2018.  General surgery was also consulted and patient underwent laparoscopic cholecystectomy on 10/01/2018.  Surgical pathology from cholecystectomy and gastric biopsy for evaluation of H. pylori pending at time of discharge.  Recommend follow-up with PCP and repeat CMP at next visit to ensure LFTs and total bilirubin have resolved/improved.  Tobacco use: Smoking 1 PPD. She is motivated to quit and feels that the nicotine patch is beneficial.  Continue to encourage continued cessation.  Discharge Diagnoses:  Active Problems:   Tobacco use    Discharge Instructions  Discharge Instructions    Call MD for:  difficulty breathing, headache or visual disturbances   Complete by: As directed    Call MD for:  persistant dizziness or light-headedness   Complete by: As directed    Call MD for:  persistant nausea and vomiting   Complete by: As directed    Call MD for:  redness, tenderness, or signs of infection (pain, swelling, redness, odor or green/yellow discharge around incision site)   Complete by: As directed    Call MD for:  severe uncontrolled pain   Complete by: As directed    Call MD for:  temperature >100.4   Complete by: As directed    Diet -  low sodium heart healthy   Complete by: As  directed    Increase activity slowly   Complete by: As directed      Allergies as of 10/02/2018      Reactions   Contrast Media [iodinated Diagnostic Agents] Anaphylaxis      Medication List    TAKE these medications   ibuprofen 200 MG tablet Commonly known as: ADVIL Take 200 mg by mouth every 6 (six) hours as needed for headache, mild pain or moderate pain.   nicotine 21 mg/24hr patch Commonly known as: NICODERM CQ - dosed in mg/24 hours Place 1 patch (21 mg total) onto the skin daily.   PEPCID PO Take 1 tablet by mouth daily.       Allergies  Allergen Reactions  . Contrast Media [Iodinated Diagnostic Agents] Anaphylaxis    Consultations:  [facility]  General surgery   Procedures/Studies: Ct Abdomen Pelvis Wo Contrast  Result Date: 09/29/2018 CLINICAL DATA:  Epigastric pain and known cholelithiasis with increasing jaundice EXAM: CT ABDOMEN AND PELVIS WITHOUT CONTRAST TECHNIQUE: Multidetector CT imaging of the abdomen and pelvis was performed following the standard protocol without IV contrast. COMPARISON:  Ultrasound from 09/27/2018 FINDINGS: Lower chest: No acute abnormality. Hepatobiliary: Liver is well visualized without significant mass lesion. The gallbladder is well distended. The known gallstones are not well appreciated on this exam. No biliary ductal dilatation is seen. Pancreas: Unremarkable. No pancreatic ductal dilatation or surrounding inflammatory changes. Spleen: Normal in size without focal abnormality. Adrenals/Urinary Tract: Adrenal glands are within normal limits. Kidneys are well visualized bilaterally. No renal calculi or obstructive changes are noted. Bladder is within normal limits. Stomach/Bowel: Stomach is within normal limits. Appendix appears normal. No evidence of bowel wall thickening, distention, or inflammatory changes. Vascular/Lymphatic: No significant vascular findings are present. No enlarged abdominal or pelvic lymph nodes. Reproductive:  Uterus and bilateral adnexa are unremarkable. Other: No abdominal wall hernia or abnormality. No abdominopelvic ascites. Musculoskeletal: No acute or significant osseous findings. IMPRESSION: No acute abnormality noted. Known cholelithiasis is not as well appreciated on CT examination. The significant increase in liver function tests is likely related to hepatocellular dysfunction as no significant obstructive changes seen. Electronically Signed   By: Inez Catalina M.D.   On: 09/29/2018 03:13   Dg Ercp Biliary & Pancreatic Ducts  Result Date: 09/30/2018 CLINICAL DATA:  Choledocholithiasis. EXAM: ERCP TECHNIQUE: Multiple spot images obtained with the fluoroscopic device and submitted for interpretation post-procedure. COMPARISON:  MRCP on 09/29/2018 FINDINGS: Images obtained with a C-arm demonstrate cannulation of the common bile duct with balloon sweep maneuver performed. IMPRESSION: Balloon sweep maneuver performed to treat choledocholithiasis. These images were submitted for radiologic interpretation only. Please see the procedural report for the amount of contrast and the fluoroscopy time utilized. Electronically Signed   By: Aletta Edouard M.D.   On: 09/30/2018 16:42   Mr Abdomen Mrcp Moise Boring Contast  Result Date: 09/29/2018 CLINICAL DATA:  Jaundice.  Known gallstones. EXAM: MRI ABDOMEN WITHOUT AND WITH CONTRAST (INCLUDING MRCP) TECHNIQUE: Multiplanar multisequence MR imaging of the abdomen was performed both before and after the administration of intravenous contrast. Heavily T2-weighted images of the biliary and pancreatic ducts were obtained, and three-dimensional MRCP images were rendered by post processing. CONTRAST:  7 cc Gadavist COMPARISON:  CT of earlier in the day.  Ultrasound of 09/27/2018. FINDINGS: Lower chest: Normal heart size without pericardial or pleural effusion. Hepatobiliary: Mild hepatic steatosis. No focal liver lesion. Multiple gallstones including at up to 1.0 cm.  No evidence of acute  cholecystitis. Mild intrahepatic biliary duct dilatation. The common duct is mildly dilated, including at maximally 9 mm in the porta hepatis on 42/11. Two tiny (1-2 mm) distal common duct stones are identified including on 37/11 and 9/13. Pancreas:  Normal, without mass or ductal dilatation. Spleen:  Normal in size, without focal abnormality. Adrenals/Urinary Tract: Normal adrenal glands. Normal kidneys, without hydronephrosis. Stomach/Bowel: Normal stomach and abdominal bowel loops. Vascular/Lymphatic: Normal caliber of the aorta and branch vessels. No abdominal adenopathy. Other:  No ascites. Musculoskeletal: No acute osseous abnormality. IMPRESSION: 1. Choledocholithiasis with mild biliary duct dilatation. 2. Cholelithiasis. 3. Mild hepatic steatosis. Electronically Signed   By: Abigail Miyamoto M.D.   On: 09/29/2018 15:20   US Abdomen Limited Ruq  Result Date: 09/27/2018 CLINICAL DATA:  Epigastric pain. EXAM: ULTRASOUND ABDOMEN LIMITED RIGHT UPPER QUADRANT COMPARISON:  Chest x-ray 02/25/2018. FINDINGS: Gallbladder: Gallstones measuring up to 1.4 cm noted. Gallbladder wall thickness 2.1 mm. Negative Murphy sign. Common bile duct: Diameter: 4.8 mm Liver: No focal lesion identified. Within normal limits in parenchymal echogenicity. Portal vein is patent on color Doppler imaging with normal direction of blood flow towards the liver. Other: None. IMPRESSION: Multiple gallstones measuring up to 1.4 cm. No evidence of cholecystitis or biliary distention. Electronically Signed   By: Marcello Moores  Register   On: 09/27/2018 05:37     ERCP 09/30/2018: - Non-bleeding erosive gastropathy. Biopsied for HP evaluation. - The major papilla appeared normal. - Filling defects consistent with stone fragments were seen on the cholangiogram. - Choledocholithiasis was found. Complete removal was accomplished by biliary sphincterotomy and balloon sweep.  Subjective: Patient seen and examined at bedside.  Resting comfortably.   Slightly upset about a family emergency regarding her grandfather this morning.  Tolerating diet without nausea.  Underwent successful cholecystectomy yesterday by general surgery.  Denies any pain.  No other complaints or concerns at this time, ready for discharge home.  Denies headache, no fever/chills/night sweats, no nausea/vomiting/diarrhea, no chest pain, no palpitations, no shortness of breath, no abdominal pain, no weakness.  No acute events overnight per nursing staff.   Discharge Exam: Vitals:   10/01/18 1954 10/02/18 0423  BP: (!) 92/47 132/79  Pulse: 73 77  Resp: 18   Temp: 98.3 F (36.8 C) 98.5 F (36.9 C)  SpO2: 96% 98%   Vitals:   10/01/18 1010 10/01/18 1608 10/01/18 1954 10/02/18 0423  BP: 125/70 114/65 (!) 92/47 132/79  Pulse: 91 76 73 77  Resp: _0 Temp:  98.4 F (36.9 C) 98.3 F (36.8 C) 98.5 F (36.9 C)  TempSrc:  Oral Oral Oral  SpO2: 92% 98% 96% 98%    General: Pt is alert, awake, not in acute distress Cardiovascular: RRR, S1/S2 +, no rubs, no gallops Respiratory: CTA bilaterally, no wheezing, no rhonchi Abdominal: Soft, NT, ND, bowel sounds + Extremities: no edema, no cyanosis    The results of significant diagnostics from this hospitalization (including imaging, microbiology, ancillary and laboratory) are listed below for reference.     Microbiology: Recent Results (from the past 240 hour(s))  SARS Coronavirus 2 [facility] order, Performed in [facility] hospital lab) Nasopharyngeal Nasopharyngeal Swab     Status: None   Collection Time: 09/29/18  1:34 AM   Specimen: Nasopharyngeal Swab  Result Value Ref Range Status   SARS Coronavirus 2 NEGATIVE NEGATIVE Final    Comment: (NOTE) If result is NEGATIVE SARS-CoV-2 target nucleic acids are NOT DETECTED. The SARS-CoV-2 RNA is  generally detectable in upper and lower  respiratory specimens during the acute phase of infection. The lowest  concentration of SARS-CoV-2 viral copies this assay can  detect is 250  copies / mL. A negative result does not preclude SARS-CoV-2 infection  and should not be used as the sole basis for treatment or other  patient management decisions.  A negative result may occur with  improper specimen collection / handling, submission of specimen other  than nasopharyngeal swab, presence of viral mutation(s) within the  areas targeted by this assay, and inadequate number of viral copies  (<250 copies / mL). A negative result must be combined with clinical  observations, patient history, and epidemiological information. If result is POSITIVE SARS-CoV-2 target nucleic acids are DETECTED. The SARS-CoV-2 RNA is generally detectable in upper and lower  respiratory specimens dur ing the acute phase of infection.  Positive  results are indicative of active infection with SARS-CoV-2.  Clinical  correlation with patient history and other diagnostic information is  necessary to determine patient infection status.  Positive results do  not rule out bacterial infection or co-infection with other viruses. If result is PRESUMPTIVE POSTIVE SARS-CoV-2 nucleic acids MAY BE PRESENT.   A presumptive positive result was obtained on the submitted specimen  and confirmed on repeat testing.  While 2019 novel coronavirus  (SARS-CoV-2) nucleic acids may be present in the submitted sample  additional confirmatory testing may be necessary for epidemiological  and / or clinical management purposes  to differentiate between  SARS-CoV-2 and other Sarbecovirus currently known to infect humans.  If clinically indicated additional testing with an alternate test  methodology [phone]) is advised. The SARS-CoV-2 RNA is generally  detectable in upper and lower respiratory sp ecimens during the acute  phase of infection. The expected result is Negative. Fact Sheet for Patients:  StrictlyIdeas.no Fact Sheet for Healthcare  Providers: BankingDealers.co.za This test is not yet approved or cleared by the [place] FDA and has been authorized for detection and/or diagnosis of SARS-CoV-2 by FDA under an Emergency Use Authorization (EUA).  This EUA will remain in effect (meaning this test can be used) for the duration of the COVID-19 declaration under Section 564(b)(1) of the Act, 21 U.S.C. section 360bbb-3(b)(1), unless the authorization is terminated or revoked sooner. Performed at [facility], [address] [place], [place] [address]      Labs: BNP (last 3 results) No results for input(s): BNP in the last 8760 hours. Basic Metabolic Panel: Recent Labs  Lab 09/27/18 0231 09/29/18 0011 09/30/18 0249 10/01/18 0327 10/02/18 0544  NA 140 137 138 139 141  K 3.8 3.5 3.9 4.3 3.9  CL 107 104 108 107 104  CO2 26 23 19* 20* 24  GLUCOSE 103* 128* 81 112* 89  BUN _0 5* 7  CREATININE 0.71 0.83 0.73 0.73 0.82  CALCIUM 9.5 9.8 9.0 9.5 9.1   Liver Function Tests: Recent Labs  Lab 09/27/18 0231 09/29/18 0011 09/30/18 0249 10/01/18 0327 10/02/18 0544  AST 125* 282* 167* 127* 126*  ALT 91* 498* 337* 311* 271*  ALKPHOS 104 188* 150* 163* 130*  BILITOT 0.5 5.5* 2.8* 3.0* 1.7*  PROT 7.5 8.0 6.3* 6.7 6.0*  ALBUMIN 4.3 4.5 3.5 3.6 3.4*   Recent Labs  Lab 09/27/18 0231  LIPASE 54*   No results for input(s): AMMONIA in the last 168 hours. CBC: Recent Labs  Lab 09/27/18 0231 09/29/18 0011 09/30/18 0249 10/01/18 0327 10/02/18 0544  WBC 10.3 5.0  4.9 4.9 9.2  NEUTROABS 7.2  --   --   --  5.2  HGB 13.3 13.6 12.4 13.2 12.7  HCT 39.6 40.7 37.4 38.4 38.2  MCV 86.5 86.2 88.8 85.7 88.4  PLT 359 314 289 262 265   Cardiac Enzymes: No results for input(s): CKTOTAL, CKMB, CKMBINDEX, TROPONINI in the last 168 hours. BNP: Invalid input(s): POCBNP CBG: No results for input(s): GLUCAP in the last 168 hours. D-Dimer No results for input(s): DDIMER in the last 72  hours. Hgb A1c No results for input(s): HGBA1C in the last 72 hours. Lipid Profile No results for input(s): CHOL, HDL, LDLCALC, TRIG, CHOLHDL, LDLDIRECT in the last 72 hours. Thyroid function studies No results for input(s): TSH, T4TOTAL, T3FREE, THYROIDAB in the last 72 hours.  Invalid input(s): FREET3 Anemia work up No results for input(s): VITAMINB12, FOLATE, FERRITIN, TIBC, IRON, RETICCTPCT in the last 72 hours. Urinalysis    Component Value Date/Time   COLORURINE AMBER (A) 09/29/2018 0011   APPEARANCEUR CLEAR (A) 09/29/2018 0011   LABSPEC 1.017 09/29/2018 0011   PHURINE 6.0 09/29/2018 0011   GLUCOSEU NEGATIVE 09/29/2018 0011   HGBUR NEGATIVE 09/29/2018 0011   BILIRUBINUR MODERATE (A) 09/29/2018 0011   KETONESUR 20 (A) 09/29/2018 0011   PROTEINUR NEGATIVE 09/29/2018 0011   NITRITE NEGATIVE 09/29/2018 0011   LEUKOCYTESUR NEGATIVE 09/29/2018 0011   Sepsis Labs Invalid input(s): PROCALCITONIN,  WBC,  LACTICIDVEN Microbiology Recent Results (from the past 240 hour(s))  SARS Coronavirus 2 [facility] order, Performed in [facility] hospital lab) Nasopharyngeal Nasopharyngeal Swab     Status: None   Collection Time: 09/29/18  1:34 AM   Specimen: Nasopharyngeal Swab  Result Value Ref Range Status   SARS Coronavirus 2 NEGATIVE NEGATIVE Final    Comment: (NOTE) If result is NEGATIVE SARS-CoV-2 target nucleic acids are NOT DETECTED. The SARS-CoV-2 RNA is generally detectable in upper and lower  respiratory specimens during the acute phase of infection. The lowest  concentration of SARS-CoV-2 viral copies this assay can detect is 250  copies / mL. A negative result does not preclude SARS-CoV-2 infection  and should not be used as the sole basis for treatment or other  patient management decisions.  A negative result may occur with  improper specimen collection / handling, submission of specimen other  than nasopharyngeal swab, presence of viral mutation(s) within the  areas  targeted by this assay, and inadequate number of viral copies  (<250 copies / mL). A negative result must be combined with clinical  observations, patient history, and epidemiological information. If result is POSITIVE SARS-CoV-2 target nucleic acids are DETECTED. The SARS-CoV-2 RNA is generally detectable in upper and lower  respiratory specimens dur ing the acute phase of infection.  Positive  results are indicative of active infection with SARS-CoV-2.  Clinical  correlation with patient history and other diagnostic information is  necessary to determine patient infection status.  Positive results do  not rule out bacterial infection or co-infection with other viruses. If result is PRESUMPTIVE POSTIVE SARS-CoV-2 nucleic acids MAY BE PRESENT.   A presumptive positive result was obtained on the submitted specimen  and confirmed on repeat testing.  While 2019 novel coronavirus  (SARS-CoV-2) nucleic acids may be present in the submitted sample  additional confirmatory testing may be necessary for epidemiological  and / or clinical management purposes  to differentiate between  SARS-CoV-2 and other Sarbecovirus currently known to infect humans.  If clinically indicated additional testing with an alternate test  methodology (  [id number]) is advised. The SARS-CoV-2 RNA is generally  detectable in upper and lower respiratory sp ecimens during the acute  phase of infection. The expected result is Negative. Fact Sheet for Patients:  StrictlyIdeas.no Fact Sheet for Healthcare Providers: BankingDealers.co.za This test is not yet approved or cleared by the [place] FDA and has been authorized for detection and/or diagnosis of SARS-CoV-2 by FDA under an Emergency Use Authorization (EUA).  This EUA will remain in effect (meaning this test can be used) for the duration of the COVID-19 declaration under Section 564(b)(1) of the Act, 21 U.S.C. section  360bbb-3(b)(1), unless the authorization is terminated or revoked sooner. Performed at [facility], [address] [place], [place] [address]      Time coordinating discharge: Over 30 minutes  SIGNED:   Eric J [place] DO  [facility] 10/02/2018, 7:38 AM

## 2018-10-02 NOTE — Progress Notes (Signed)
RN went into the patient's room to administer pain medication.   Patient was lying in bed crying.   RN asked patient what happened.   Patient states that she just received a call about a family emergency related to her grandfather. Patient requesting the doctor to come see her now so that she may be discharged as she is already scheduled for discharge today.   Patient states that she will leave without seeing a doctor (AMA) if he is unable to come soon.  RN paged doctor.   Dr. Dema Severin returned call and informed RN to explain to patient that if she leaves AMA that health insurance will not cover the hospital bill. He also asked RN to let the patient know that the surgical team will round between 0800 and 1000 if she is able to stay until that time.   RN explained all details to patient. Patient currently agreeing to stay until 1000.   Nursing will continue to monitor.

## 2018-10-03 ENCOUNTER — Telehealth: Payer: Self-pay

## 2018-10-03 DIAGNOSIS — R17 Unspecified jaundice: Secondary | ICD-10-CM

## 2018-10-03 NOTE — Telephone Encounter (Signed)
appt made for 9/29 at 25 am with Dr Jerilynn Mages.  Letter mailed to the pt with appt information and advising to get labs.  Lab order in Epic

## 2018-10-03 NOTE — Telephone Encounter (Signed)
-----   Message from Irving Copas., MD sent at 10/02/2018  7:43 PM EDT ----- Chong Sicilian, Patient needs a hepatic function panel in approximately 2 weeks.  Follow-up in clinic in approximately 4 to 6 weeks. Thank you. GM

## 2018-10-04 ENCOUNTER — Encounter: Payer: Self-pay | Admitting: Gastroenterology

## 2018-10-11 ENCOUNTER — Ambulatory Visit: Payer: Medicaid Other | Admitting: General Surgery

## 2018-11-22 ENCOUNTER — Ambulatory Visit: Payer: Medicaid Other | Admitting: Gastroenterology

## 2020-06-26 IMAGING — CR DG CHEST 2V
1 series · 2 of 2 positions shown · non-contrast
Comparison: None.

CLINICAL DATA: Cough and chest tightness.

EXAM:
CHEST - 2 VIEW

[Series 2: w chest pa · 0.14mm/px · 2 of 2 slices shown]
[im 1/2]
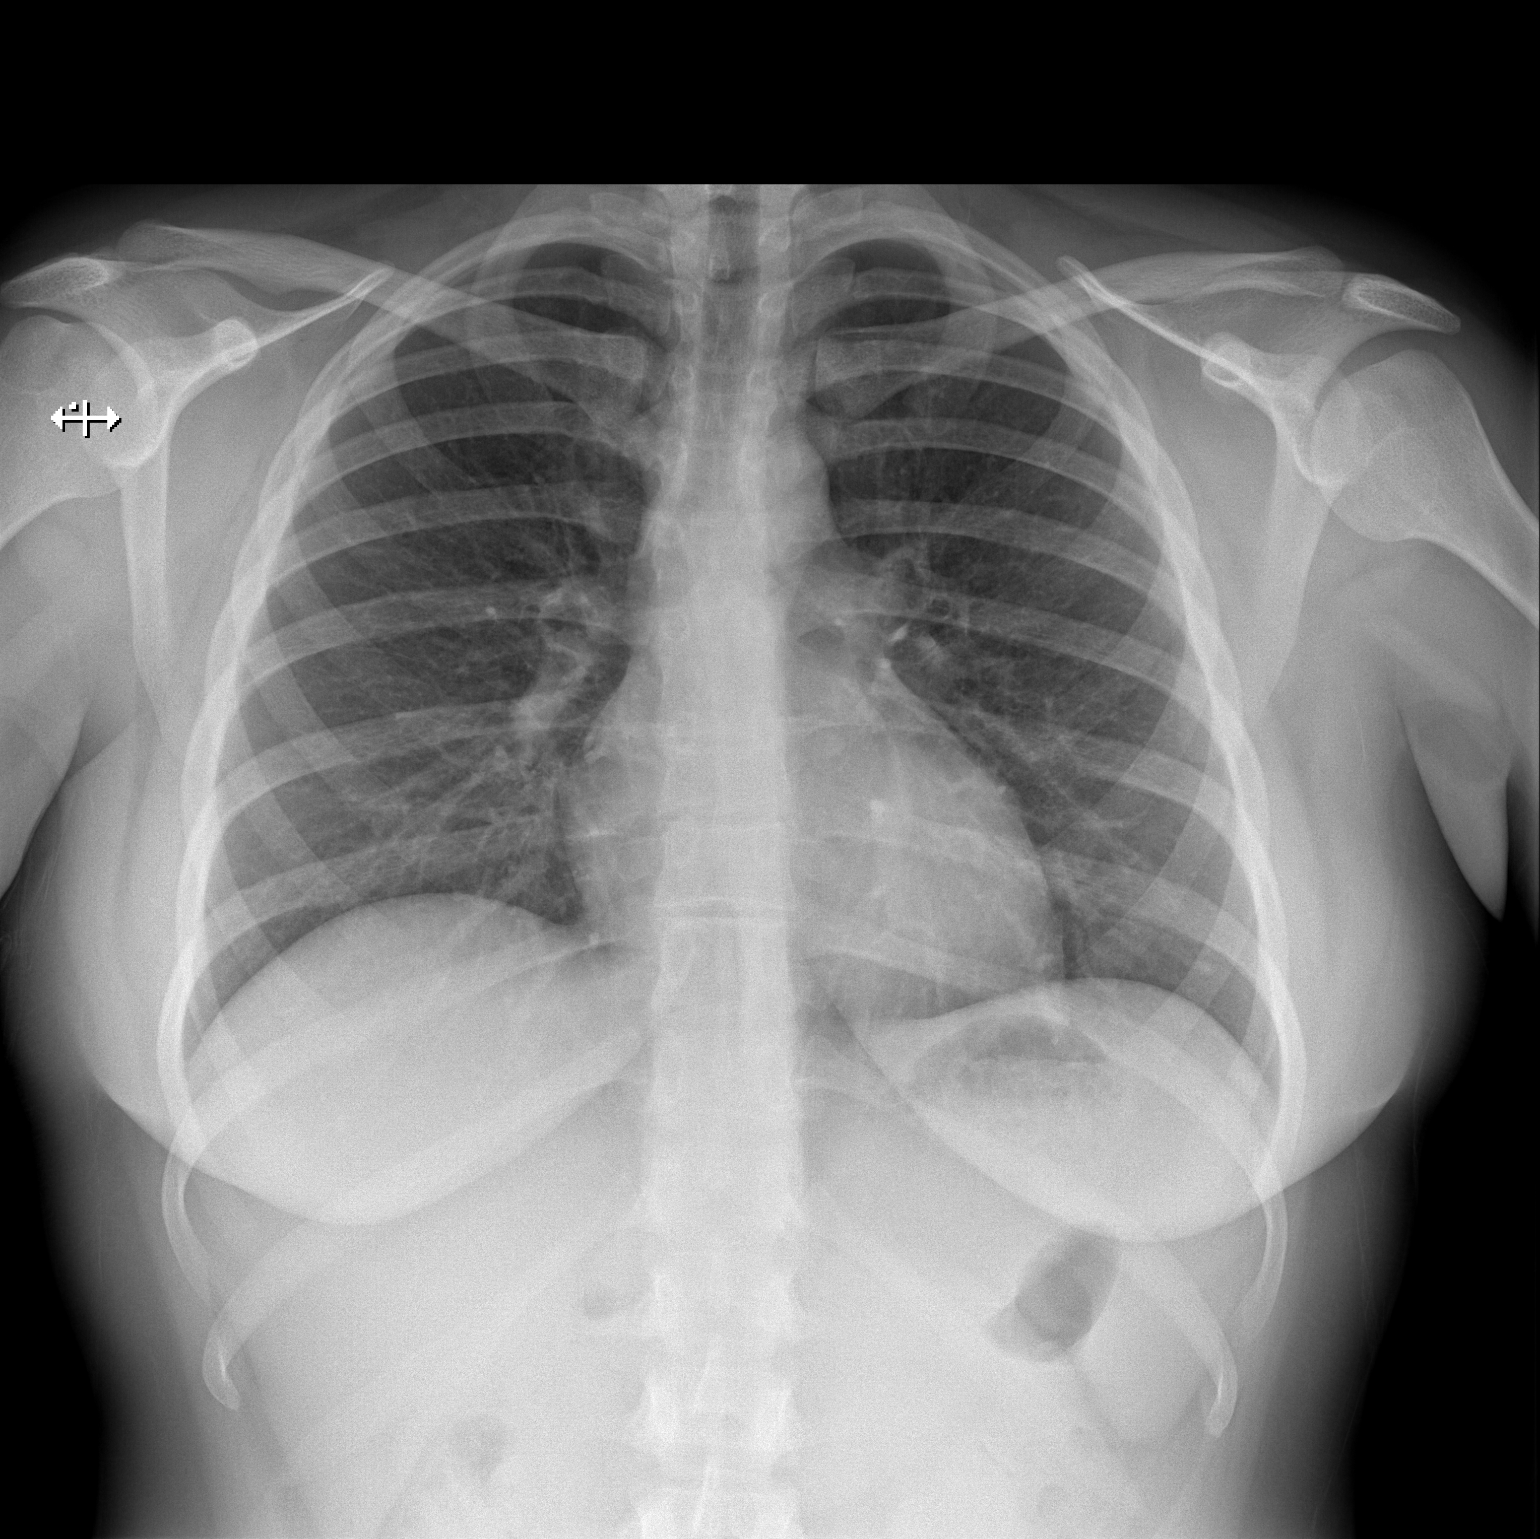
[im 2/2]
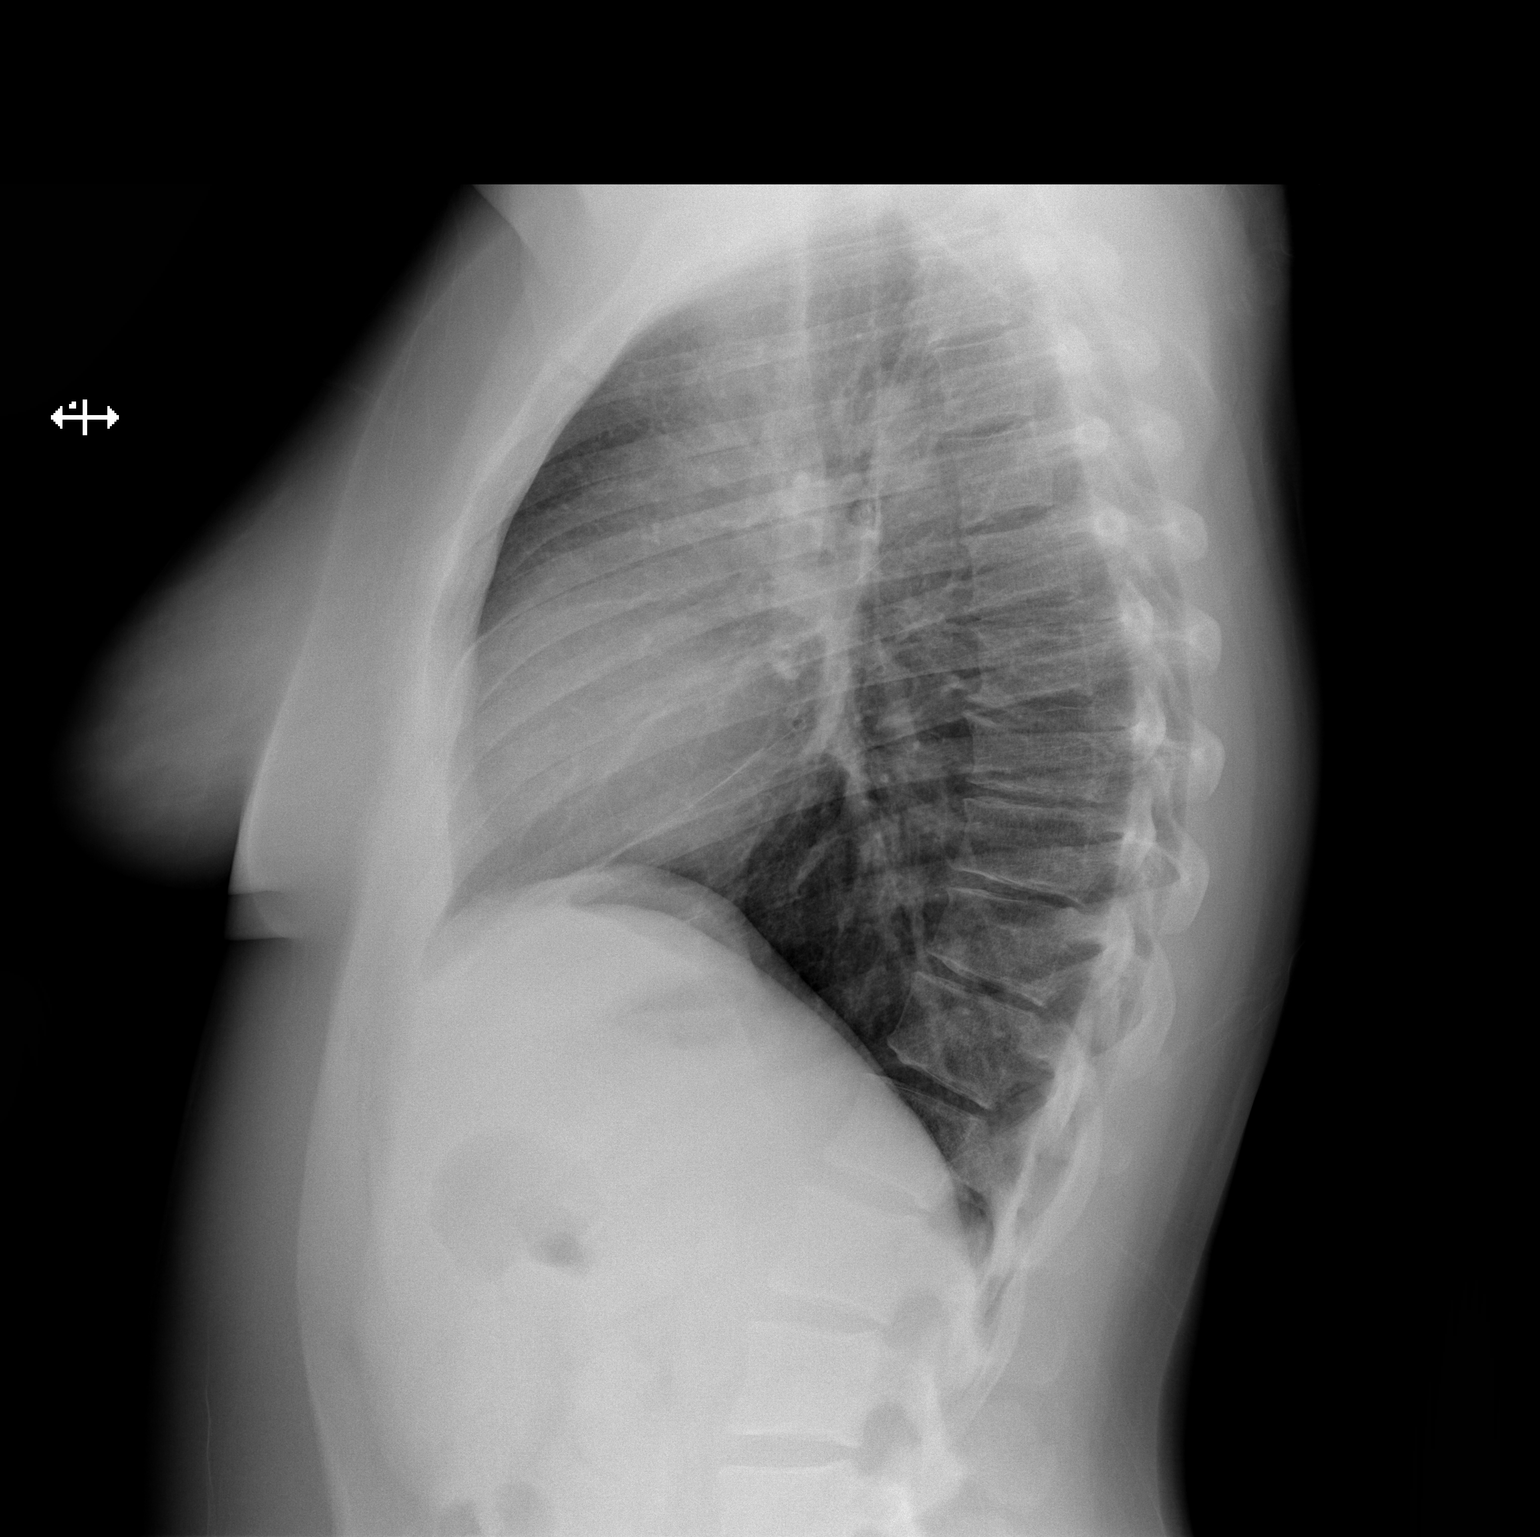

[2 of 2 positions shown; findings below may reference images not displayed]

FINDINGS: The heart size and mediastinal contours are within normal limits.
Both lungs are clear except for slight peribronchial thickening. The
visualized skeletal structures are unremarkable.
IMPRESSION: Slight bronchitic changes.

## 2020-11-12 ENCOUNTER — Encounter: Payer: Self-pay | Admitting: General Surgery

## 2021-01-28 IMAGING — CT CT ABDOMEN AND PELVIS WITHOUT CONTRAST
2 of 4 series · 15 of 46 positions shown, 17 images · non-contrast
Comparison: Ultrasound from 09/27/2018

CLINICAL DATA: Epigastric pain and known cholelithiasis with
increasing jaundice

EXAM:
CT ABDOMEN AND PELVIS WITHOUT CONTRAST
TECHNIQUE: Multidetector CT imaging of the abdomen and pelvis was performed
following the standard protocol without IV contrast.

[Series 2: axial st · axial · 0.76mm/px · z∈[-846,-386]mm · 12 of 102 slices shown, 14 images]
[im 5/102  soft-tissue]
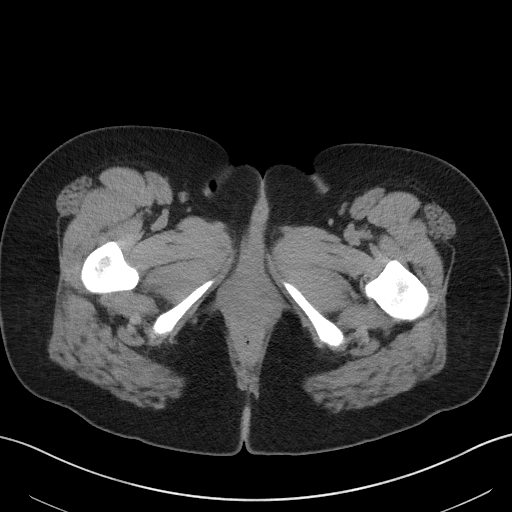
[im 5/102  bone]
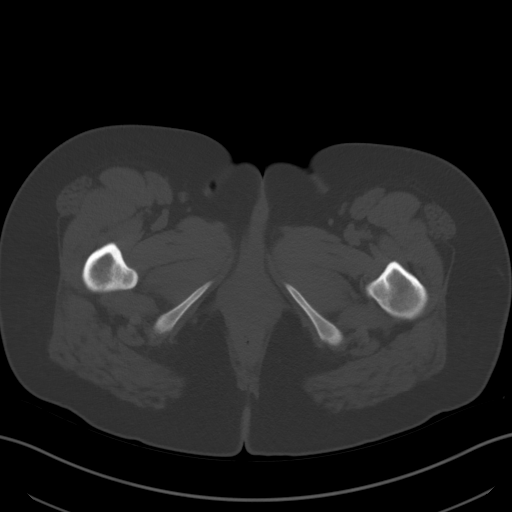
[im 13/102  soft-tissue]
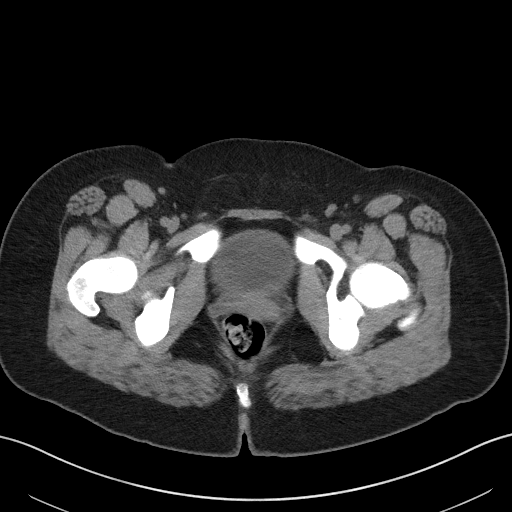
[im 22/102  soft-tissue]
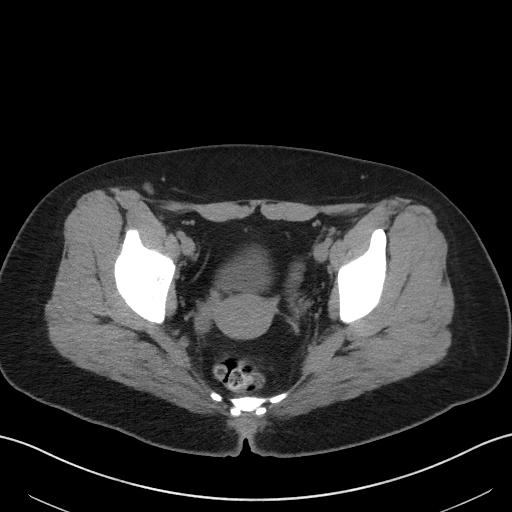
[im 30/102  soft-tissue]
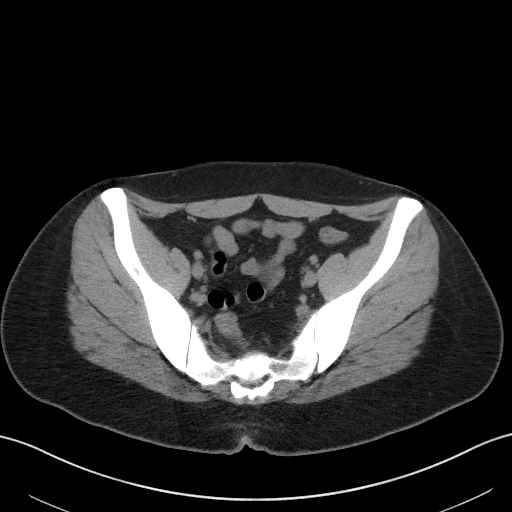
[im 38/102  soft-tissue]
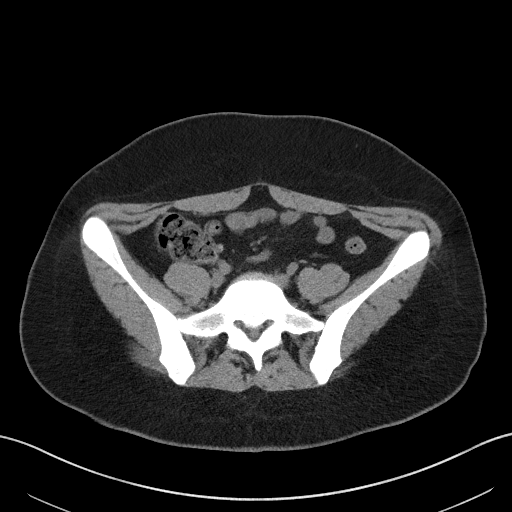
[im 47/102  soft-tissue]
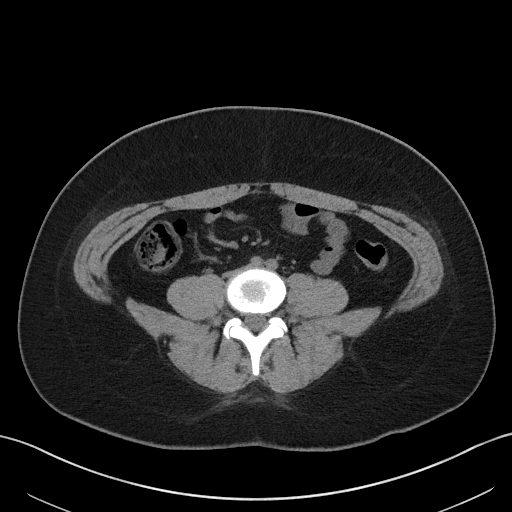
[im 55/102  soft-tissue]
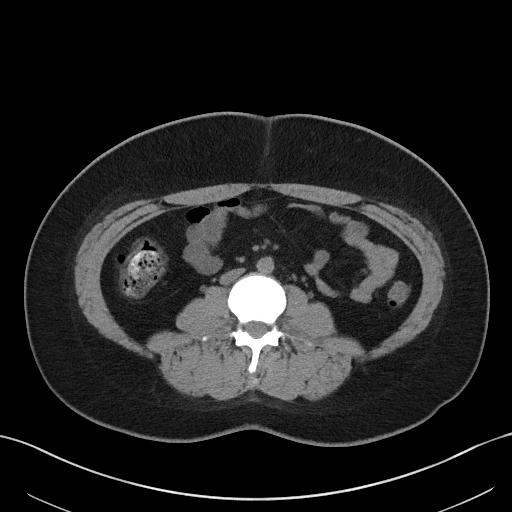
[im 64/102  soft-tissue]
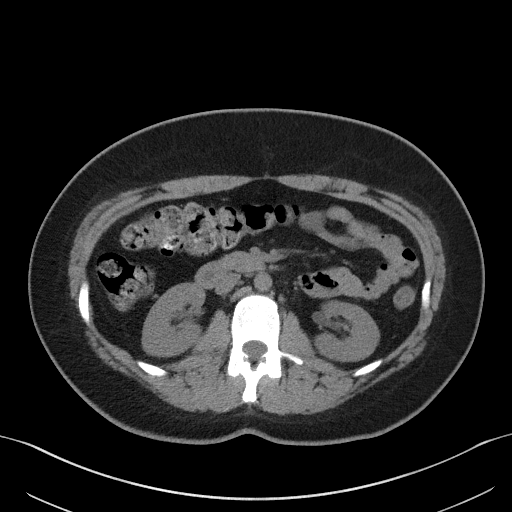
[im 72/102  soft-tissue]
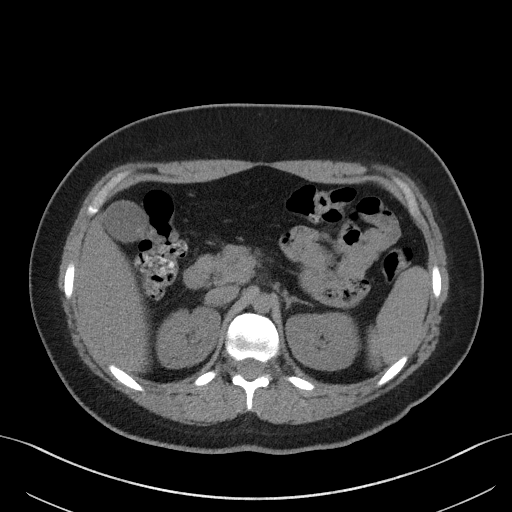
[im 72/102  bone]
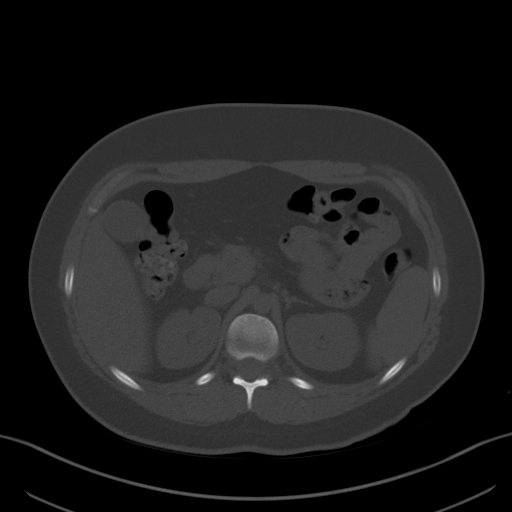
[im 80/102  soft-tissue]
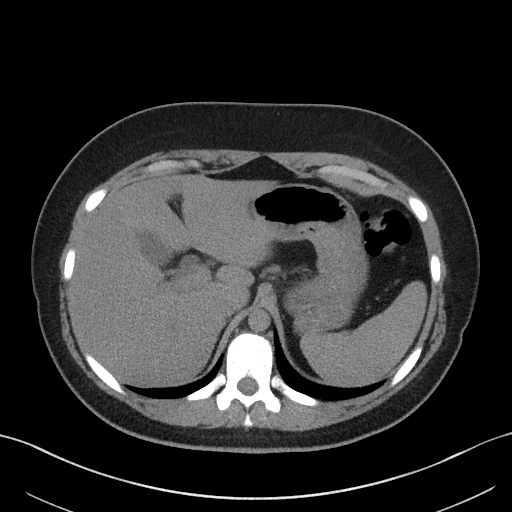
[im 89/102  soft-tissue]
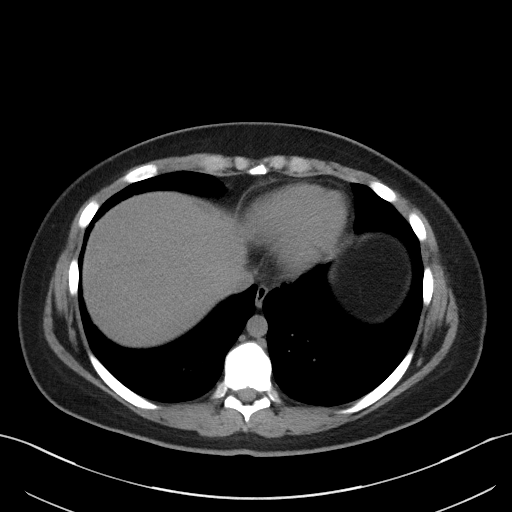
[im 97/102  soft-tissue]
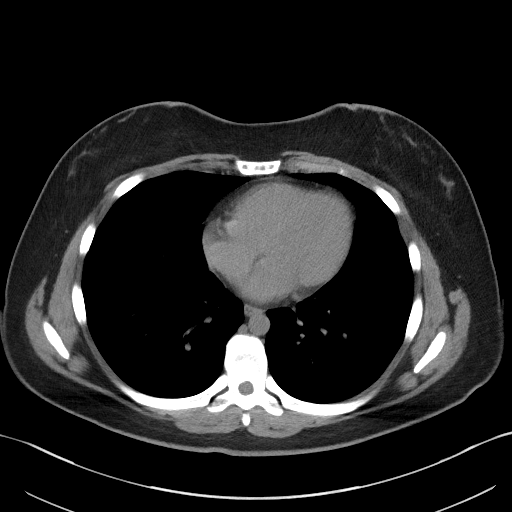

[Series 5: coronal st · coronal · 0.70mm/px · 3 of 82 slices shown]
[im 28/82  soft-tissue]
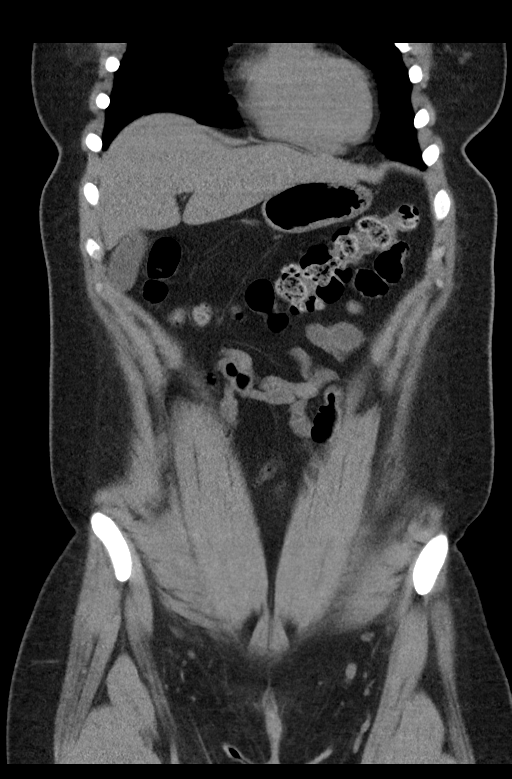
[im 37/82  soft-tissue]
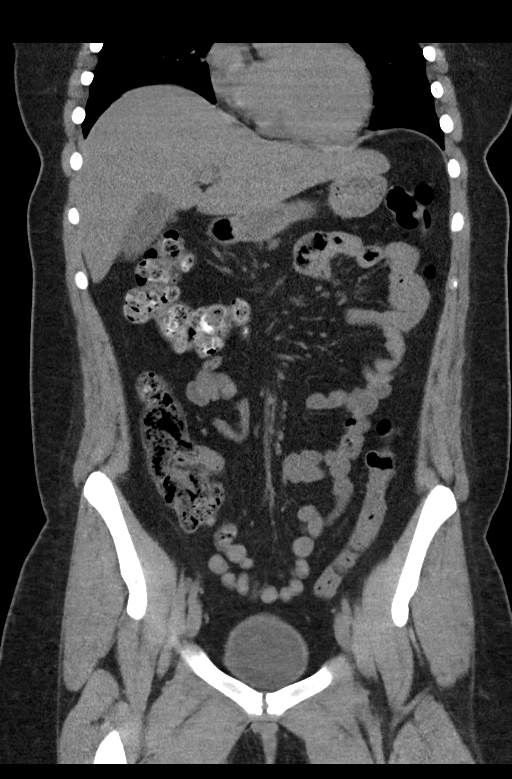
[im 46/82  soft-tissue]
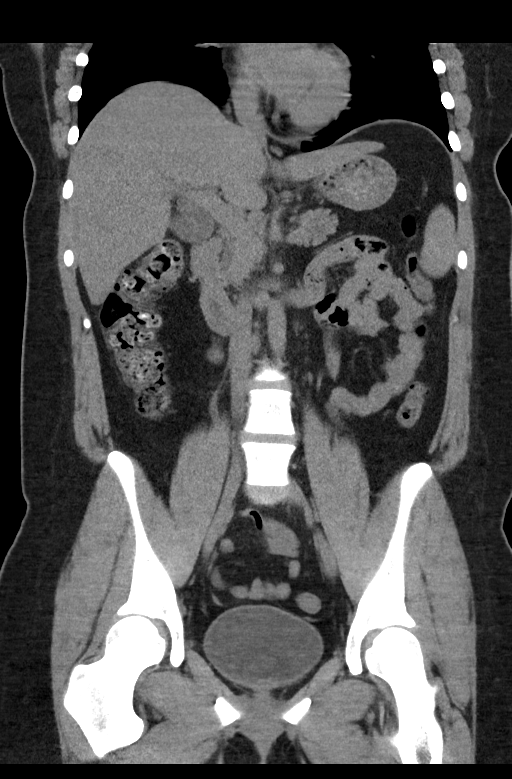

[15 of 46 positions shown; findings below may reference images not displayed]

FINDINGS: Lower chest: No acute abnormality.

Hepatobiliary: Liver is well visualized without significant mass
lesion. The gallbladder is well distended. The known gallstones are
not well appreciated on this exam. No biliary ductal dilatation is
seen.

Pancreas: Unremarkable. No pancreatic ductal dilatation or
surrounding inflammatory changes.

Spleen: Normal in size without focal abnormality.

Adrenals/Urinary Tract: Adrenal glands are within normal limits.
Kidneys are well visualized bilaterally. No renal calculi or
obstructive changes are noted. Bladder is within normal limits.

Stomach/Bowel: Stomach is within normal limits. Appendix appears
normal. No evidence of bowel wall thickening, distention, or
inflammatory changes.

Vascular/Lymphatic: No significant vascular findings are present. No
enlarged abdominal or pelvic lymph nodes.

Reproductive: Uterus and bilateral adnexa are unremarkable.

Other: No abdominal wall hernia or abnormality. No abdominopelvic
ascites.

Musculoskeletal: No acute or significant osseous findings.
IMPRESSION: No acute abnormality noted. Known cholelithiasis is not as well
appreciated on CT examination. The significant increase in liver
function tests is likely related to hepatocellular dysfunction as no
significant obstructive changes seen.

## 2021-01-29 IMAGING — RF ERCP
1 series · 8 of 8 positions shown · non-contrast
Comparison: MRCP on 09/29/2018

CLINICAL DATA: Choledocholithiasis.

EXAM:
ERCP
TECHNIQUE: Multiple spot images obtained with the fluoroscopic device and
submitted for interpretation post-procedure.

[Series 1: unknown protocol · 0.20mm/px · 5 acquisitions, 8 frames shown]
[im 1/5]
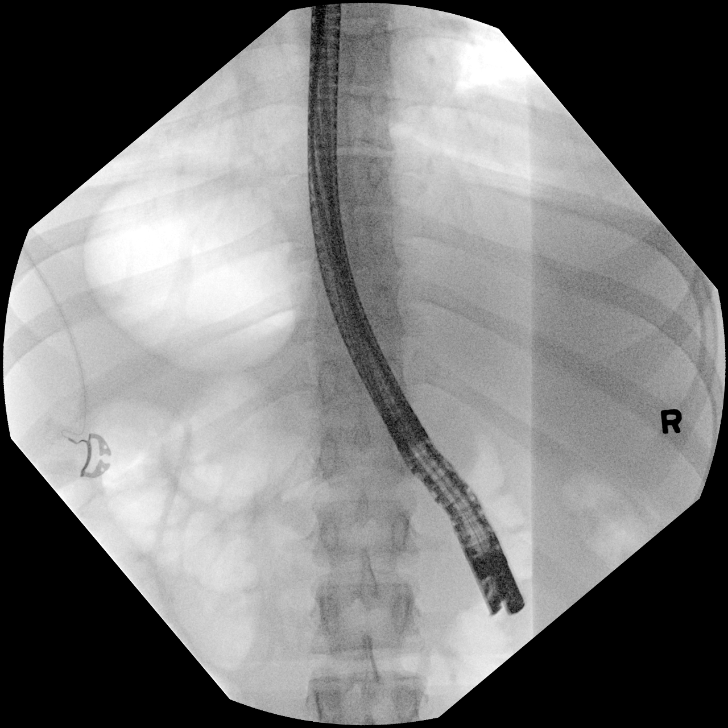
[im 2/5]
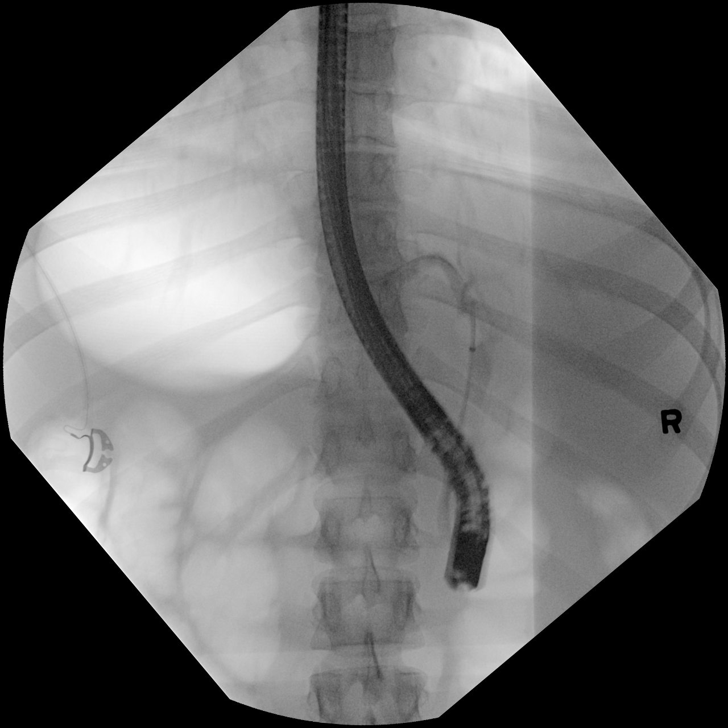
[im 2/5]
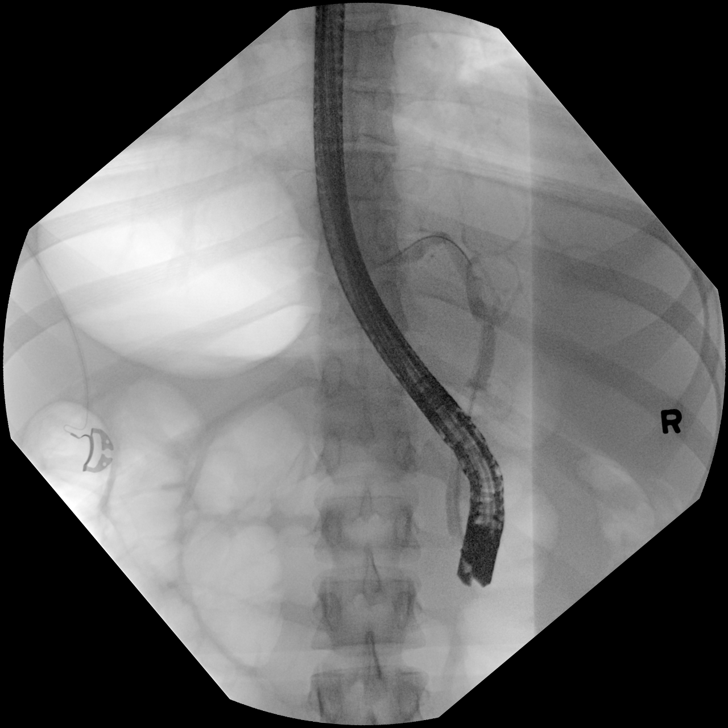
[im 2/5]
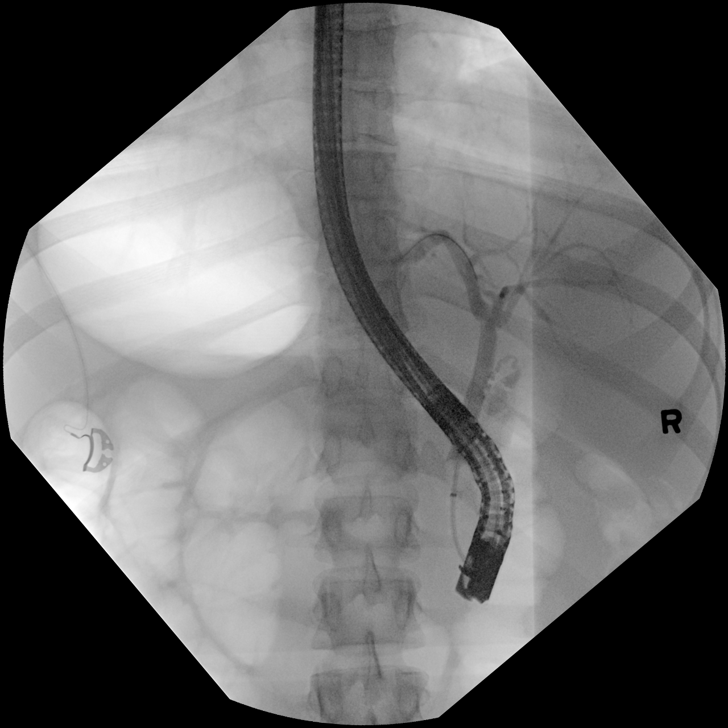
[im 2/5]
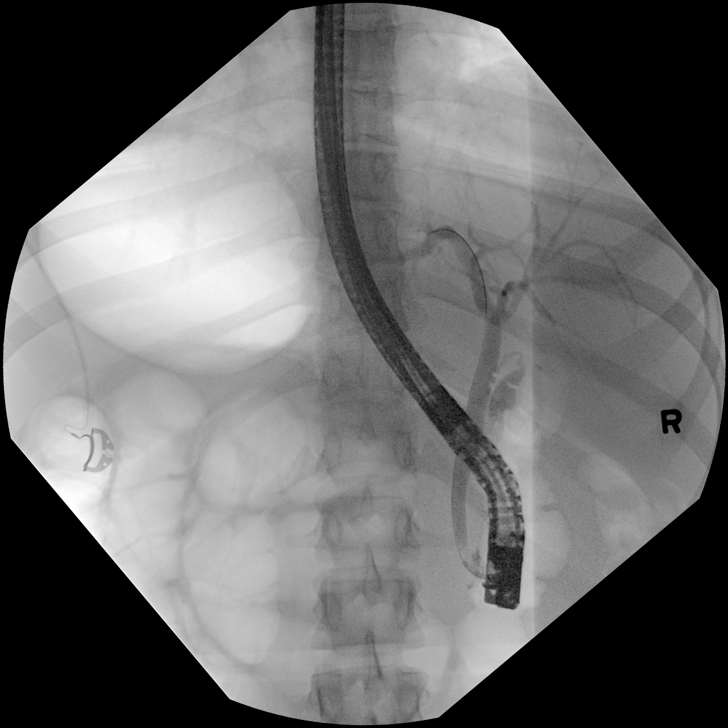
[im 3/5]
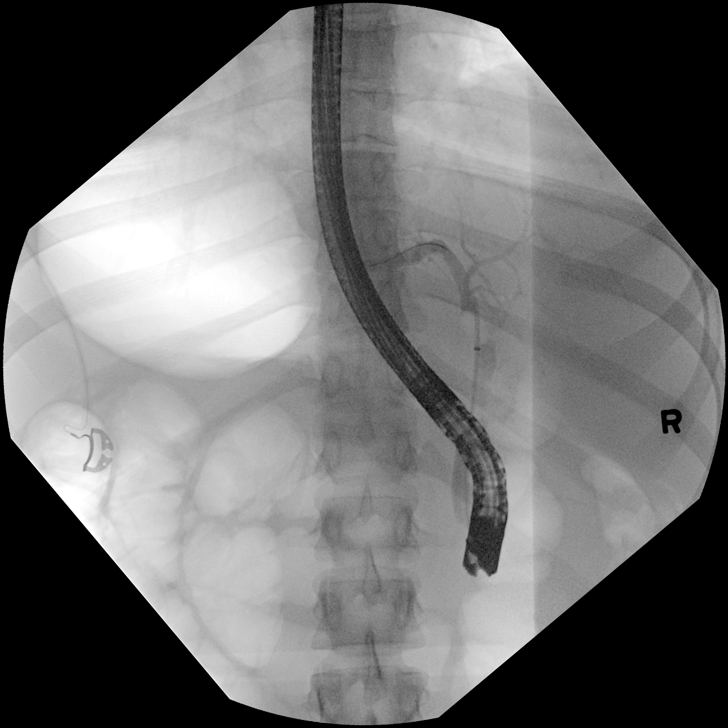
[im 4/5]
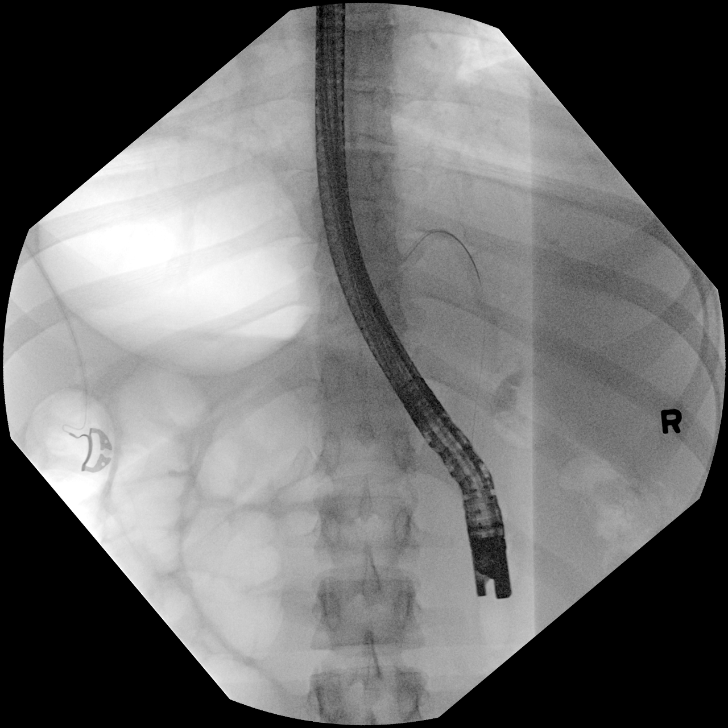
[im 5/5]
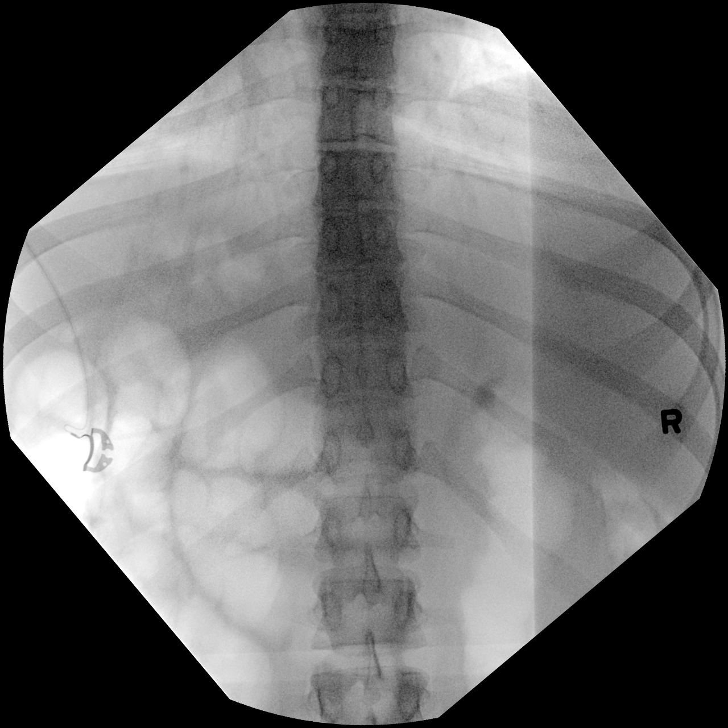

[8 of 8 positions shown; findings below may reference images not displayed]

FINDINGS: Images obtained with a C-arm demonstrate cannulation of the common
bile duct with balloon sweep maneuver performed.
IMPRESSION: Balloon sweep maneuver performed to treat choledocholithiasis.

These images were submitted for radiologic interpretation only.
Please see the procedural report for the amount of contrast and the
fluoroscopy time utilized.
